# Patient Record
Sex: Female | Born: 1960 | Hispanic: No | Smoking: Never smoker
Health system: Southern US, Community
[De-identification: ages and names within clinical notes are randomized; demographics above are authoritative.]

## PROBLEM LIST (undated history)

## (undated) DIAGNOSIS — A048 Other specified bacterial intestinal infections: Secondary | ICD-10-CM

## (undated) DIAGNOSIS — E785 Hyperlipidemia, unspecified: Secondary | ICD-10-CM

## (undated) DIAGNOSIS — I10 Essential (primary) hypertension: Secondary | ICD-10-CM

## (undated) HISTORY — DX: Other specified bacterial intestinal infections: A04.8

## (undated) HISTORY — DX: Essential (primary) hypertension: I10

## (undated) HISTORY — PX: NO PAST SURGERIES: SHX2092

---

## 2013-10-31 ENCOUNTER — Ambulatory Visit: Payer: Self-pay

## 2013-11-02 ENCOUNTER — Ambulatory Visit: Payer: Self-pay

## 2014-04-24 ENCOUNTER — Ambulatory Visit: Payer: Self-pay | Attending: Internal Medicine

## 2014-07-10 ENCOUNTER — Ambulatory Visit (INDEPENDENT_AMBULATORY_CARE_PROVIDER_SITE_OTHER): Payer: No Typology Code available for payment source | Admitting: Family Medicine

## 2014-07-10 ENCOUNTER — Telehealth: Payer: Self-pay | Admitting: Family Medicine

## 2014-07-10 VITALS — BP 154/80 | HR 106 | Temp 101.9°F | Resp 17 | Ht 65.5 in | Wt 175.0 lb

## 2014-07-10 DIAGNOSIS — J020 Streptococcal pharyngitis: Secondary | ICD-10-CM

## 2014-07-10 DIAGNOSIS — J029 Acute pharyngitis, unspecified: Secondary | ICD-10-CM

## 2014-07-10 DIAGNOSIS — R300 Dysuria: Secondary | ICD-10-CM

## 2014-07-10 DIAGNOSIS — R110 Nausea: Secondary | ICD-10-CM

## 2014-07-10 DIAGNOSIS — R509 Fever, unspecified: Secondary | ICD-10-CM

## 2014-07-10 DIAGNOSIS — E860 Dehydration: Secondary | ICD-10-CM

## 2014-07-10 DIAGNOSIS — N3000 Acute cystitis without hematuria: Secondary | ICD-10-CM

## 2014-07-10 LAB — POCT URINALYSIS DIPSTICK
Bilirubin, UA: NEGATIVE
Glucose, UA: NEGATIVE
KETONES UA: 15
NITRITE UA: NEGATIVE
PH UA: 6
Protein, UA: 100
SPEC GRAV UA: 1.025
Urobilinogen, UA: 1

## 2014-07-10 LAB — COMPLETE METABOLIC PANEL WITH GFR
ALBUMIN: 3.8 g/dL (ref 3.5–5.2)
ALT: 9 U/L (ref 0–35)
AST: 14 U/L (ref 0–37)
Alkaline Phosphatase: 72 U/L (ref 39–117)
BUN: 11 mg/dL (ref 6–23)
CALCIUM: 9.1 mg/dL (ref 8.4–10.5)
CO2: 23 mEq/L (ref 19–32)
Chloride: 99 mEq/L (ref 96–112)
Creat: 0.81 mg/dL (ref 0.50–1.10)
GFR, EST NON AFRICAN AMERICAN: 83 mL/min
GLUCOSE: 119 mg/dL — AB (ref 70–99)
POTASSIUM: 4.1 meq/L (ref 3.5–5.3)
Sodium: 136 mEq/L (ref 135–145)
Total Bilirubin: 0.5 mg/dL (ref 0.2–1.2)
Total Protein: 7.2 g/dL (ref 6.0–8.3)

## 2014-07-10 LAB — POCT CBC
GRANULOCYTE PERCENT: 86.7 % — AB (ref 37–80)
HEMATOCRIT: 41.1 % (ref 37.7–47.9)
Hemoglobin: 13.5 g/dL (ref 12.2–16.2)
Lymph, poc: 1.2 (ref 0.6–3.4)
MCH, POC: 27.7 pg (ref 27–31.2)
MCHC: 32.8 g/dL (ref 31.8–35.4)
MCV: 84.4 fL (ref 80–97)
MID (cbc): 0.4 (ref 0–0.9)
MPV: 8.2 fL (ref 0–99.8)
POC GRANULOCYTE: 10.5 — AB (ref 2–6.9)
POC LYMPH %: 9.8 % — AB (ref 10–50)
POC MID %: 3.5 %M (ref 0–12)
Platelet Count, POC: 190 10*3/uL (ref 142–424)
RBC: 4.88 M/uL (ref 4.04–5.48)
RDW, POC: 13.9 %
WBC: 12.1 10*3/uL — AB (ref 4.6–10.2)

## 2014-07-10 LAB — POCT UA - MICROSCOPIC ONLY
Casts, Ur, LPF, POC: NEGATIVE
Crystals, Ur, HPF, POC: NEGATIVE
MUCUS UA: NEGATIVE
Yeast, UA: NEGATIVE

## 2014-07-10 LAB — POCT RAPID STREP A (OFFICE): Rapid Strep A Screen: POSITIVE — AB

## 2014-07-10 MED ORDER — ACETAMINOPHEN 325 MG PO TABS
1000.0000 mg | ORAL_TABLET | Freq: Once | ORAL | Status: AC
  Administered 2014-07-10: 975 mg via ORAL

## 2014-07-10 MED ORDER — CEFTRIAXONE SODIUM 1 G IJ SOLR
1.0000 g | Freq: Once | INTRAMUSCULAR | Status: AC
  Administered 2014-07-10: 1 g via INTRAMUSCULAR

## 2014-07-10 MED ORDER — CEPHALEXIN 500 MG PO CAPS: 500.0000 mg | ORAL_CAPSULE | Freq: Three times a day (TID) | ORAL | Status: DC

## 2014-07-10 MED ORDER — ONDANSETRON 4 MG PO TBDP: 4.0000 mg | ORAL_TABLET | Freq: Three times a day (TID) | ORAL | Status: DC | PRN

## 2014-07-10 NOTE — Progress Notes (Addendum)
Subjective:  Patient ID: April Bell, female    DOB: 1960-07-14  Age: 54 y.o. MRN: [id number]  54 year old [place] lady who has been in the [place] since this spring. She does not speak [place]. She is accompanied by her relatives who do speak [place]. Her cousin who is with her is a nephrologist, currently moving to [facility], who is able to communicate well and the other niece is a Physiological scientist so he is capable of communicating well also. The patient had some dysuria last week. It was during the Ramadan fast so she was not getting much to drink. Yesterday she got worse. She had a sore throat. She has felt nauseated but not vomited. She developed a high fever. She still had some more dysuria. She is not on any regular medications. She took some Tylenol, none today. She is getting very weak feeling and weak enough that it is hard to walk.  No major past medical history. Lives with her family. Does not work. No major familial diseases.  Review of systems: HEENT sore throat Respiratory: Unremarkable Cardiovascular: Unremarkable GI: Not much appetite GU: Dysuria Constitutional: Weak Endocrine: Unremarkable Dermatologic: Unremarkable   She is not on any chronic medications and does not have any chronic illnesses. She is usually healthy.   Objective:   Ill-appearing lady, bundled up in her Islamic garb. Throat is erythematous without exudate. Right tonsil is swollen more than the left. Neck supple without significant nodes. Chest is clear to auscultation. Heart regular without murmurs. Tachycardic. Abdomen soft without mass or tenderness. Her skin is very warm to touch. Extremities without edema. Deviated tenderness.  Results for orders placed or performed in visit on 07/10/14  POCT CBC  Result Value Ref Range   WBC 12.1 (A) 4.6 - 10.2 K/uL   Lymph, poc 1.2 0.6 - 3.4   POC LYMPH PERCENT 9.8 (A) 10 - 50 %L   MID (cbc) 0.4 0 - 0.9   POC MID % 3.5 0 - 12 %M   POC Granulocyte  10.5 (A) 2 - 6.9   Granulocyte percent 86.7 (A) 37 - 80 %G   RBC 4.88 4.04 - 5.48 M/uL   Hemoglobin 13.5 12.2 - 16.2 g/dL   HCT, POC 03.4 74.2 - 47.9 %   MCV 84.4 80 - 97 fL   MCH, POC 27.7 27 - 31.2 pg   MCHC 32.8 31.8 - 35.4 g/dL   RDW, POC 59.5 %   Platelet Count, POC 190 142 - 424 K/uL   MPV 8.2 0 - 99.8 fL  POCT urinalysis dipstick  Result Value Ref Range   Color, UA yellow    Clarity, UA cloudy    Glucose, UA neg    Bilirubin, UA neg    Ketones, UA 15    Spec Grav, UA 1.025    Blood, UA mod    pH, UA 6.0    Protein, UA 100    Urobilinogen, UA 1.0    Nitrite, UA neg    Leukocytes, UA large (3+) (A) Negative  POCT UA - Microscopic Only  Result Value Ref Range   WBC, Ur, HPF, POC tntc    RBC, urine, microscopic 0-2    Bacteria, U Microscopic many    Mucus, UA neg    Epithelial cells, urine per micros 10-15    Crystals, Ur, HPF, POC neg    Casts, Ur, LPF, POC neg    Yeast, UA neg   POCT rapid strep A  Result Value Ref Range  Rapid Strep A Screen Positive (A) Negative   Repeat exam reveals a temperature of 102.3 at 1:05 PM. Still ill-appearing.  Assessment & Plan:   Assessment: Streptococcal pharyngitis Urinary tract infection Fever Generalized malaise Dehydration  Patient is quite ill and this was a fairly high acuity visit. Plan: Patient Instructions  Push fluids (nondairy, nonalcoholic)  Take ondansetron if needed for nausea  Take cephalexin 500 mg 3 times daily for infection  Tylenol 1000 mg 3 times daily or ibuprofen 600 mg 3 times daily if needed for fever  If worse return or go to the emergency room     Nagee Goates, MD 07/10/2014

## 2014-07-10 NOTE — Telephone Encounter (Signed)
Patient was seen today and had two prescriptions sent in. Patient is on [facility] financial assistance program and [facility]   [phone]

## 2014-07-10 NOTE — Patient Instructions (Addendum)
Push fluids (nondairy, nonalcoholic)  Take ondansetron if needed for nausea  Take cephalexin 500 mg 3 times daily for infection  Tylenol 1000 mg 3 times daily or ibuprofen 600 mg 3 times daily if needed for fever  If worse return or go to the emergency room  Urinary Tract Infection Urinary tract infections (UTIs) can develop anywhere along your urinary tract. Your urinary tract is your body's drainage system for removing wastes and extra water. Your urinary tract includes two kidneys, two ureters, a bladder, and a urethra. Your kidneys are a pair of bean-shaped organs. Each kidney is about the size of your fist. They are located below your ribs, one on each side of your spine. CAUSES Infections are caused by microbes, which are microscopic organisms, including fungi, viruses, and bacteria. These organisms are so small that they can only be seen through a microscope. Bacteria are the microbes that most commonly cause UTIs. SYMPTOMS  Symptoms of UTIs may vary by age and gender of the patient and by the location of the infection. Symptoms in young women typically include a frequent and intense urge to urinate and a painful, burning feeling in the bladder or urethra during urination. Older women and men are more likely to be tired, shaky, and weak and have muscle aches and abdominal pain. A fever may mean the infection is in your kidneys. Other symptoms of a kidney infection include pain in your back or sides below the ribs, nausea, and vomiting. DIAGNOSIS To diagnose a UTI, your caregiver will ask you about your symptoms. Your caregiver also will ask to provide a urine sample. The urine sample will be tested for bacteria and white blood cells. White blood cells are made by your body to help fight infection. TREATMENT  Typically, UTIs can be treated with medication. Because most UTIs are caused by a bacterial infection, they usually can be treated with the use of antibiotics. The choice of antibiotic  and length of treatment depend on your symptoms and the type of bacteria causing your infection. HOME CARE INSTRUCTIONS  If you were prescribed antibiotics, take them exactly as your caregiver instructs you. Finish the medication even if you feel better after you have only taken some of the medication.  Drink enough water and fluids to keep your urine clear or pale yellow.  Avoid caffeine, tea, and carbonated beverages. They tend to irritate your bladder.  Empty your bladder often. Avoid holding urine for long periods of time.  Empty your bladder before and after sexual intercourse.  After a bowel movement, women should cleanse from front to back. Use each tissue only once. SEEK MEDICAL CARE IF:   You have back pain.  You develop a fever.  Your symptoms do not begin to resolve within 3 days. SEEK IMMEDIATE MEDICAL CARE IF:   You have severe back pain or lower abdominal pain.  You develop chills.  You have nausea or vomiting.  You have continued burning or discomfort with urination. MAKE SURE YOU:   Understand these instructions.  Will watch your condition.  Will get help right away if you are not doing well or get worse. Document Released: 09/30/2004 Document Revised: 06/22/2011 Document Reviewed: 01/29/2011 [facility] Patient Information 2015 [place], [place]. This information is not intended to replace advice given to you by your health care provider. Make sure you discuss any questions you have with your health care provider. Strep Throat Strep throat is an infection of the throat caused by a bacteria named Streptococcus pyogenes.  Your health care provider may call the infection streptococcal "tonsillitis" or "pharyngitis" depending on whether there are signs of inflammation in the tonsils or back of the throat. Strep throat is most common in children aged 5-15 years during the cold months of the year, but it can occur in people of any age during any season. This infection is  spread from person to person (contagious) through coughing, sneezing, or other close contact. SIGNS AND SYMPTOMS   Fever or chills.  Painful, swollen, red tonsils or throat.  Pain or difficulty when swallowing.  White or yellow spots on the tonsils or throat.  Swollen, tender lymph nodes or "glands" of the neck or under the jaw.  Red rash all over the body (rare). DIAGNOSIS  Many different infections can cause the same symptoms. A test must be done to confirm the diagnosis so the right treatment can be given. A "rapid strep test" can help your health care provider make the diagnosis in a few minutes. If this test is not available, a light swab of the infected area can be used for a throat culture test. If a throat culture test is done, results are usually available in a day or two. TREATMENT  Strep throat is treated with antibiotic medicine. HOME CARE INSTRUCTIONS   Gargle with 1 tsp of salt in 1 cup of warm water, 3-4 times per day or as needed for comfort.  Family members who also have a sore throat or fever should be tested for strep throat and treated with antibiotics if they have the strep infection.  Make sure everyone in your household washes their hands well.  Do not share food, drinking cups, or personal items that could cause the infection to spread to others.  You may need to eat a soft food diet until your sore throat gets better.  Drink enough water and fluids to keep your urine clear or pale yellow. This will help prevent dehydration.  Get plenty of rest.  Stay home from school, day care, or work until you have been on antibiotics for 24 hours.  Take medicines only as directed by your health care provider.  Take your antibiotic medicine as directed by your health care provider. Finish it even if you start to feel better. SEEK MEDICAL CARE IF:   The glands in your neck continue to enlarge.  You develop a rash, cough, or earache.  You cough up green,  yellow-brown, or bloody sputum.  You have pain or discomfort not controlled by medicines.  Your problems seem to be getting worse rather than better.  You have a fever. SEEK IMMEDIATE MEDICAL CARE IF:   You develop any new symptoms such as vomiting, severe headache, stiff or painful neck, chest pain, shortness of breath, or trouble swallowing.  You develop severe throat pain, drooling, or changes in your voice.  You develop swelling of the neck, or the skin on the neck becomes red and tender.  You develop signs of dehydration, such as fatigue, dry mouth, and decreased urination.  You become increasingly sleepy, or you cannot wake up completely. MAKE SURE YOU:  Understand these instructions.  Will watch your condition.  Will get help right away if you are not doing well or get worse. Document Released: 12/19/1999 Document Revised: 05/07/2013 Document Reviewed: 02/19/2010 [facility] Patient Information 2015 [place], [place]. This information is not intended to replace advice given to you by your health care provider. Make sure you discuss any questions you have with your health care  provider.  

## 2014-07-10 NOTE — Addendum Note (Signed)
Addended by: Deagan Sevin H on: 07/10/2014 02:04 PM   Modules accepted: Level of Service

## 2014-07-12 LAB — URINE CULTURE

## 2014-09-17 ENCOUNTER — Encounter: Payer: Self-pay | Admitting: Family Medicine

## 2014-09-17 ENCOUNTER — Ambulatory Visit (INDEPENDENT_AMBULATORY_CARE_PROVIDER_SITE_OTHER): Payer: No Typology Code available for payment source | Admitting: Family Medicine

## 2014-09-17 VITALS — BP 132/70 | HR 68 | Temp 98.2°F | Resp 16 | Ht 65.0 in | Wt 180.0 lb

## 2014-09-17 DIAGNOSIS — Z23 Encounter for immunization: Secondary | ICD-10-CM

## 2014-09-17 DIAGNOSIS — K219 Gastro-esophageal reflux disease without esophagitis: Secondary | ICD-10-CM

## 2014-09-17 DIAGNOSIS — R82998 Other abnormal findings in urine: Secondary | ICD-10-CM

## 2014-09-17 DIAGNOSIS — N390 Urinary tract infection, site not specified: Secondary | ICD-10-CM

## 2014-09-17 DIAGNOSIS — R1084 Generalized abdominal pain: Secondary | ICD-10-CM

## 2014-09-17 LAB — POCT URINALYSIS DIP (DEVICE)
Bilirubin Urine: NEGATIVE
Glucose, UA: NEGATIVE mg/dL
Ketones, ur: NEGATIVE mg/dL
NITRITE: NEGATIVE
PH: 7 (ref 5.0–8.0)
PROTEIN: NEGATIVE mg/dL
Specific Gravity, Urine: 1.02 (ref 1.005–1.030)
UROBILINOGEN UA: 0.2 mg/dL (ref 0.0–1.0)

## 2014-09-17 LAB — COMPLETE METABOLIC PANEL WITH GFR
ALT: 11 U/L (ref 6–29)
AST: 17 U/L (ref 10–35)
Albumin: 4 g/dL (ref 3.6–5.1)
Alkaline Phosphatase: 68 U/L (ref 33–130)
BILIRUBIN TOTAL: 0.3 mg/dL (ref 0.2–1.2)
BUN: 14 mg/dL (ref 7–25)
CALCIUM: 9.1 mg/dL (ref 8.6–10.4)
CHLORIDE: 104 mmol/L (ref 98–110)
CO2: 21 mmol/L (ref 20–31)
CREATININE: 0.6 mg/dL (ref 0.50–1.05)
GFR, Est African American: >89 mL/min (ref >=60)
GFR, Est Non African American: >89 mL/min (ref >=60)
Glucose, Bld: 106 mg/dL — ABNORMAL HIGH (ref 65–99)
Potassium: 4.1 mmol/L (ref 3.5–5.3)
Sodium: 139 mmol/L (ref 135–146)
TOTAL PROTEIN: 7.1 g/dL (ref 6.1–8.1)

## 2014-09-17 MED ORDER — OMEPRAZOLE 20 MG PO CPDR: 20.0000 mg | DELAYED_RELEASE_CAPSULE | Freq: Every day | ORAL | Status: DC

## 2014-09-17 NOTE — Progress Notes (Signed)
Subjective:    Patient ID: April Bell, female    DOB: [birth year], 54 y.o.   MRN: [id number]  HPI Ms. April Bell, a 54 year old female accompanied by daughter to establish care. Ms. April Bell primarily speaks [place], but understands some english. Patient is complaining of heartburn for the past several months. She states that she has been receiving primary care from [facility]. Paitent complains of heartburn. This has been associated with belching, dysphagia, heartburn, midespigastric pain and nausea.  She denies abdominal bloating, early satiety, fullness after meals, shortness of breath, upper abdominal discomfort and wheezing. Symptoms have been present for several weeks. She denies dysphagia.  She has not lost weight. She denies melena, hematochezia, hematemesis, and coffee ground emesis. She reports that she has taken OTC Ibuprofen without relief.   History reviewed. No pertinent past medical history. Social History   Social History  . Marital Status: Married    Spouse Name: N/A  . Number of Children: N/A  . Years of Education: N/A   Occupational History  . Not on file.   Social History Main Topics  . Smoking status: Former Games developer  . Smokeless tobacco: Not on file  . Alcohol Use: No  . Drug Use: No  . Sexual Activity: Not on file   Other Topics Concern  . Not on file   Social History Narrative   Review of Systems  Constitutional: Negative.   HENT: Negative.   Eyes: Negative.   Respiratory: Negative.   Cardiovascular: Negative.   Gastrointestinal: Negative.        Heartburn  Endocrine: Negative for polydipsia, polyphagia and polyuria.  Genitourinary: Negative.   Musculoskeletal: Negative.   Skin: Negative.   Neurological: Negative.   Hematological: Negative.   Psychiatric/Behavioral: Negative.        Objective:   Physical Exam  Constitutional: She is oriented to person, place, and time. She appears well-developed and well-nourished.  HENT:   Head: Normocephalic and atraumatic.  Right Ear: External ear normal.  Left Ear: External ear normal.  Mouth/Throat: Oropharynx is clear and moist.  Eyes: Conjunctivae and EOM are normal. Pupils are equal, round, and reactive to light.  Neck: Normal range of motion. Neck supple.  Cardiovascular: Normal rate, regular rhythm, normal heart sounds and intact distal pulses.   Pulmonary/Chest: Effort normal and breath sounds normal.  Abdominal: Soft. Bowel sounds are normal. There is tenderness in the epigastric area.  Musculoskeletal: Normal range of motion.  Neurological: She is alert and oriented to person, place, and time. She has normal reflexes.  Skin: Skin is warm and dry.  Psychiatric: She has a normal mood and affect. Her behavior is normal. Judgment and thought content normal.      BP 132/70 mmHg  Pulse 68  Temp(Src) 98.2 F (36.8 C) (Oral)  Resp 16  Ht  (1.651 m)  Wt 180 lb (81.647 kg)  BMI 29.95 kg/m2 Assessment & Plan:  1. Gastroesophageal reflux disease without esophagitis Will start a 6 week trial of omeprazole Avoid lying down until 2 hours after meals Elevate the head of bed including the chest Patient advised to avoid fatty foods, chocolate, peppermint, sauces, and citrus fruits - omeprazole (PRILOSEC) 20 MG capsule; Take 1 capsule (20 mg total) by mouth daily.  Dispense: 42 capsule; Refill: 3  2.  Generalized abdominal pain Patient advised to start a lowfat, low carbohydrate diet divided over 5-6 small meals and increase water intake.  - POCT urinalysis dipstick - Hemoglobin A1c -  COMPLETE METABOLIC PANEL WITH GFR - CBC with Differential  3. Urine leukocytes -Send urine for culture  4. Need for immunization against influenza  - Flu Vaccine QUAD 36+ mos IM (Fluarix)   Preventative care: Recommend a screening mammogram and pap smear Patient to follow up for a complete physical examination in 1 month  Shannyn Jankowiak M, FNP

## 2014-09-18 LAB — CBC WITH DIFFERENTIAL/PLATELET
Basophils Absolute: 0.1 10*3/uL (ref 0.0–0.1)
Basophils Relative: 1 % (ref 0–1)
EOS ABS: 0.2 10*3/uL (ref 0.0–0.7)
EOS PCT: 4 % (ref 0–5)
HEMATOCRIT: 39 % (ref 36.0–46.0)
Hemoglobin: 12.9 g/dL (ref 12.0–15.0)
LYMPHS ABS: 3.4 10*3/uL (ref 0.7–4.0)
LYMPHS PCT: 59 % — AB (ref 12–46)
MCH: 28.4 pg (ref 26.0–34.0)
MCHC: 33.1 g/dL (ref 30.0–36.0)
MCV: 85.7 fL (ref 78.0–100.0)
MONO ABS: 0.4 10*3/uL (ref 0.1–1.0)
MPV: 10.1 fL (ref 8.6–12.4)
Monocytes Relative: 7 % (ref 3–12)
Neutro Abs: 1.7 10*3/uL (ref 1.7–7.7)
Neutrophils Relative %: 29 % — ABNORMAL LOW (ref 43–77)
PLATELETS: 273 10*3/uL (ref 150–400)
RBC: 4.55 MIL/uL (ref 3.87–5.11)
RDW: 14.2 % (ref 11.5–15.5)
WBC: 5.8 10*3/uL (ref 4.0–10.5)

## 2014-09-18 LAB — HEMOGLOBIN A1C
HEMOGLOBIN A1C: 5.9 % — AB (ref <5.7)
Mean Plasma Glucose: 123 mg/dL — ABNORMAL HIGH (ref <117)

## 2014-09-19 ENCOUNTER — Telehealth: Payer: Self-pay | Admitting: Family Medicine

## 2014-09-19 ENCOUNTER — Other Ambulatory Visit: Payer: Self-pay | Admitting: Family Medicine

## 2014-09-19 DIAGNOSIS — R82998 Other abnormal findings in urine: Secondary | ICD-10-CM | POA: Insufficient documentation

## 2014-09-19 DIAGNOSIS — R7303 Prediabetes: Secondary | ICD-10-CM

## 2014-09-19 DIAGNOSIS — K219 Gastro-esophageal reflux disease without esophagitis: Secondary | ICD-10-CM | POA: Insufficient documentation

## 2014-09-19 LAB — URINE CULTURE

## 2014-09-19 NOTE — Telephone Encounter (Signed)
Spoke with patient's daughter (caregiver) and advised of elevated hgba1c and to have patient do a low fat/carb diet/ to increase water intake to 6 to 8 glasses daily, and to get approximently 150 minutes of cardiovascular exercise  weekly as tolerated. Patient's daughter stated understanding and will have patient follow directions. Thanks!

## 2014-09-19 NOTE — Telephone Encounter (Signed)
Called left message for patient to return call regarding labs.

## 2014-09-19 NOTE — Telephone Encounter (Signed)
Reviewed labs. Hemoglobin a1C is elevated at 5.9, which is consistent with pre-diabetes, goal is <5.7%. Recommend a lowfat, low carbohydrate diet divided over 5-6 small meals, increase water intake to 6-8 glasses, and 150 minutes per week of cardiovascular exercise.   Will re-evaluated hemoglobin a1C in 6 months.    Massie Maroon, FNP

## 2014-10-28 ENCOUNTER — Ambulatory Visit: Payer: No Typology Code available for payment source | Attending: Internal Medicine

## 2014-10-29 ENCOUNTER — Ambulatory Visit (INDEPENDENT_AMBULATORY_CARE_PROVIDER_SITE_OTHER): Payer: No Typology Code available for payment source | Admitting: Family Medicine

## 2014-10-29 ENCOUNTER — Encounter: Payer: Self-pay | Admitting: Family Medicine

## 2014-10-29 VITALS — BP 114/68 | HR 58 | Temp 98.3°F | Resp 16 | Ht 65.0 in | Wt 177.0 lb

## 2014-10-29 DIAGNOSIS — Z1239 Encounter for other screening for malignant neoplasm of breast: Secondary | ICD-10-CM

## 2014-10-29 DIAGNOSIS — K219 Gastro-esophageal reflux disease without esophagitis: Secondary | ICD-10-CM

## 2014-10-29 DIAGNOSIS — R82998 Other abnormal findings in urine: Secondary | ICD-10-CM

## 2014-10-29 DIAGNOSIS — N390 Urinary tract infection, site not specified: Secondary | ICD-10-CM

## 2014-10-29 DIAGNOSIS — R7303 Prediabetes: Secondary | ICD-10-CM

## 2014-10-29 LAB — POCT URINALYSIS DIP (DEVICE)
BILIRUBIN URINE: NEGATIVE
Glucose, UA: NEGATIVE mg/dL
KETONES UR: NEGATIVE mg/dL
NITRITE: NEGATIVE
PH: 7.5 (ref 5.0–8.0)
Protein, ur: NEGATIVE mg/dL
SPECIFIC GRAVITY, URINE: 1.015 (ref 1.005–1.030)
Urobilinogen, UA: 0.2 mg/dL (ref 0.0–1.0)

## 2014-10-29 MED ORDER — OMEPRAZOLE 20 MG PO CPDR: 20.0000 mg | DELAYED_RELEASE_CAPSULE | Freq: Every day | ORAL | Status: DC

## 2014-10-29 NOTE — Patient Instructions (Signed)
Food Choices for Gastroesophageal Reflux Disease, Adult °When you have gastroesophageal reflux disease (GERD), the foods you eat and your eating habits are very important. Choosing the right foods can help ease the discomfort of GERD. °WHAT GENERAL GUIDELINES DO I NEED TO FOLLOW? °· Choose fruits, vegetables, whole grains, low-fat dairy products, and low-fat meat, fish, and poultry. °· Limit fats such as oils, salad dressings, butter, nuts, and avocado. °· Keep a food diary to identify foods that cause symptoms. °· Avoid foods that cause reflux. These may be different for different people. °· Eat frequent small meals instead of three large meals each day. °· Eat your meals slowly, in a relaxed setting. °· Limit fried foods. °· Cook foods using methods other than frying. °· Avoid drinking alcohol. °· Avoid drinking large amounts of liquids with your meals. °· Avoid bending over or lying down until 2-3 hours after eating. °WHAT FOODS ARE NOT RECOMMENDED? °The following are some foods and drinks that may worsen your symptoms: °Vegetables °Tomatoes. Tomato juice. Tomato and spaghetti sauce. Chili peppers. Onion and garlic. Horseradish. °Fruits °Oranges, grapefruit, and lemon (fruit and juice). °Meats °High-fat meats, fish, and poultry. This includes hot dogs, ribs, ham, sausage, salami, and bacon. °Dairy °Whole milk and chocolate milk. Sour cream. Cream. Butter. Ice cream. Cream cheese.  °Beverages °Coffee and tea, with or without caffeine. Carbonated beverages or energy drinks. °Condiments °Hot sauce. Barbecue sauce.  °Sweets/Desserts °Chocolate and cocoa. Donuts. Peppermint and spearmint. °Fats and Oils °High-fat foods, including French fries and potato chips. °Other °Vinegar. Strong spices, such as black pepper, white pepper, red pepper, cayenne, curry powder, cloves, ginger, and chili powder. °The items listed above may not be a complete list of foods and beverages to avoid. Contact your dietitian for more  information. °  °This information is not intended to replace advice given to you by your health care provider. Make sure you discuss any questions you have with your health care provider. °  °Document Released: 12/21/2004 Document Revised: 01/11/2014 Document Reviewed: 10/25/2012 °Elsevier Interactive Patient Education ©2016 [employer] Inc[employer] ° °Gastroesophageal Reflux Disease, Adult °Normally, food travels down the esophagus and stays in the stomach to be digested. However, when a person has gastroesophageal reflux disease (GERD), food and stomach acid move back up into the esophagus. When this happens, the esophagus becomes sore and inflamed. Over time, GERD can create small holes (ulcers) in the lining of the esophagus.  °CAUSES °This condition is caused by a problem with the muscle between the esophagus and the stomach (lower esophageal sphincter, or LES). Normally, the LES muscle closes after food passes through the esophagus to the stomach. When the LES is weakened or abnormal, it does not close properly, and that allows food and stomach acid to go back up into the esophagus. The LES can be weakened by certain dietary substances, medicines, and medical conditions, including: °· Tobacco use. °· Pregnancy. °· Having a hiatal hernia. °· Heavy alcohol use. °· Certain foods and beverages, such as coffee, chocolate, onions, and peppermint. °RISK FACTORS °This condition is more likely to develop in: °· People who have an increased body weight. °· People who have connective tissue disorders. °· People who use NSAID medicines. °SYMPTOMS °Symptoms of this condition include: °· Heartburn. °· Difficult or painful swallowing. °· The feeling of having a lump in the throat. °· A bitter taste in the mouth. °· Bad breath. °· Having a large amount of saliva. °· Having an upset or bloated stomach. °· Belching. °· Chest pain. °·   Shortness of breath or wheezing. °· Ongoing (chronic) cough or a night-time cough. °· Wearing away of  tooth enamel. °· Weight loss. °Different conditions can cause chest pain. Make sure to see your health care provider if you experience chest pain. °DIAGNOSIS °Your health care provider will take a medical history and perform a physical exam. To determine if you have mild or severe GERD, your health care provider may also monitor how you respond to treatment. You may also have other tests, including: °· An endoscopy to examine your stomach and esophagus with a small camera. °· A test that measures the acidity level in your esophagus. °· A test that measures how much pressure is on your esophagus. °· A barium swallow or modified barium swallow to show the shape, size, and functioning of your esophagus. °TREATMENT °The goal of treatment is to help relieve your symptoms and to prevent complications. Treatment for this condition may vary depending on how severe your symptoms are. Your health care provider may recommend: °· Changes to your diet. °· Medicine. °· Surgery. °HOME CARE INSTRUCTIONS °Diet °· Follow a diet as recommended by your health care provider. This may involve avoiding foods and drinks such as: °¨ Coffee and tea (with or without caffeine). °¨ Drinks that contain alcohol. °¨ Energy drinks and sports drinks. °¨ Carbonated drinks or sodas. °¨ Chocolate and cocoa. °¨ Peppermint and mint flavorings. °¨ Garlic and onions. °¨ Horseradish. °¨ Spicy and acidic foods, including peppers, chili powder, curry powder, vinegar, hot sauces, and barbecue sauce. °¨ Citrus fruit juices and citrus fruits, such as oranges, lemons, and limes. °¨ Tomato-based foods, such as red sauce, chili, salsa, and pizza with red sauce. °¨ Fried and fatty foods, such as donuts, french fries, potato chips, and high-fat dressings. °¨ High-fat meats, such as hot dogs and fatty cuts of red and white meats, such as rib eye steak, sausage, ham, and bacon. °¨ High-fat dairy items, such as whole milk, butter, and cream cheese. °· Eat small,  frequent meals instead of large meals. °· Avoid drinking large amounts of liquid with your meals. °· Avoid eating meals during the 2-3 hours before bedtime. °· Avoid lying down right after you eat. °· Do not exercise right after you eat. ° General Instructions  °· Pay attention to any changes in your symptoms. °· Take over-the-counter and prescription medicines only as told by your health care provider. Do not take aspirin, ibuprofen, or other NSAIDs unless your health care provider told you to do so. °· Do not use any tobacco products, including cigarettes, chewing tobacco, and e-cigarettes. If you need help quitting, ask your health care provider. °· Wear loose-fitting clothing. Do not wear anything tight around your waist that causes pressure on your abdomen. °· Raise (elevate) the head of your bed 6 inches (15cm). °· Try to reduce your stress, such as with yoga or meditation. If you need help reducing stress, ask your health care provider. °· If you are overweight, reduce your weight to an amount that is healthy for you. Ask your health care provider for guidance about a safe weight loss goal. °· Keep all follow-up visits as told by your health care provider. This is important. °SEEK MEDICAL CARE IF: °· You have new symptoms. °· You have unexplained weight loss. °· You have difficulty swallowing, or it hurts to swallow. °· You have wheezing or a persistent cough. °· Your symptoms do not improve with treatment. °· You have a hoarse voice. °SEEK IMMEDIATE MEDICAL CARE IF: °· You have pain   in your arms, neck, jaw, teeth, or back. °· You feel sweaty, dizzy, or light-headed. °· You have chest pain or shortness of breath. °· You vomit and your vomit looks like blood or coffee grounds. °· You faint. °· Your stool is bloody or black. °· You cannot swallow, drink, or eat. °  °This information is not intended to replace advice given to you by your health care provider. Make sure you discuss any questions you have with  your health care provider. °  °Document Released: 09/30/2004 Document Revised: 09/11/2014 Document Reviewed: 04/17/2014 °Elsevier Interactive Patient Education ©2016 Elsevier Inc. ° °

## 2014-10-29 NOTE — Progress Notes (Signed)
Subjective:    Patient ID: April Bell, female    DOB: 11/13/1960, 54 y.o.   MRN: [id number]  Gastroesophageal Reflux She reports no abdominal pain, no belching, no chest pain, no choking, no coughing, no dysphagia, no early satiety, no globus sensation, no heartburn, no hoarse voice, no nausea, no sore throat, no stridor, no tooth decay, no water brash or no wheezing. This is a recurrent problem. The current episode started more than 1 month ago. The problem occurs rarely. The problem has been gradually improving. Nothing aggravates the symptoms. Pertinent negatives include no fatigue. Risk factors include lack of exercise and caffeine use. She has tried a histamine-2 antagonist for the symptoms. The treatment provided significant relief.   April Bell, a 54 year old female accompanied by daughter for a 1 month follow up of GERD. April Bell primarily speaks Arabic, but understands some english. Her daughter is assisting with interpreting.  Patient states that heartburn has improved on trial of Omeprazole.   She denies abdominal bloating, early satiety, fullness after meals, shortness of breath, upper abdominal discomfort and wheezing.  She has not lost weight. She denies melena, hematochezia, hematemesis, and coffee ground emesis.   No past medical history on file. Social History   Social History  . Marital Status: Married    Spouse Name: N/A  . Number of Children: N/A  . Years of Education: N/A   Occupational History  . Not on file.   Social History Main Topics  . Smoking status: Former Games developermoker  . Smokeless tobacco: Not on file  . Alcohol Use: No  . Drug Use: No  . Sexual Activity: Not on file   Other Topics Concern  . Not on file   Social History Narrative   Immunization History  Administered Date(s) Administered  . Influenza,inj,Quad PF,36+ Mos 09/17/2014   Review of Systems  Constitutional: Negative.  Negative for fatigue.  HENT: Negative.  Negative for  hoarse voice and sore throat.   Eyes: Negative.   Respiratory: Negative.  Negative for cough, choking and wheezing.   Cardiovascular: Negative.  Negative for chest pain.  Gastrointestinal: Negative for heartburn, dysphagia, nausea, abdominal pain, blood in stool and abdominal distention.  Endocrine: Negative for polydipsia, polyphagia and polyuria.  Genitourinary: Negative.   Musculoskeletal: Negative.   Skin: Negative.   Allergic/Immunologic: Negative for immunocompromised state.  Neurological: Negative.   Hematological: Negative.   Psychiatric/Behavioral: Negative.        Objective:   Physical Exam  Constitutional: She is oriented to person, place, and time. She appears well-developed and well-nourished.  HENT:  Head: Normocephalic and atraumatic.  Right Ear: External ear normal.  Left Ear: External ear normal.  Mouth/Throat: Oropharynx is clear and moist.  Eyes: Conjunctivae and EOM are normal. Pupils are equal, round, and reactive to light.  Neck: Normal range of motion. Neck supple.  Cardiovascular: Normal rate, regular rhythm, normal heart sounds and intact distal pulses.   Pulmonary/Chest: Effort normal and breath sounds normal.  Abdominal: Soft. Bowel sounds are normal. There is tenderness in the epigastric area.  Musculoskeletal: Normal range of motion.  Neurological: She is alert and oriented to person, place, and time. She has normal reflexes.  Skin: Skin is warm and dry.  Psychiatric: She has a normal mood and affect. Her behavior is normal. Judgment and thought content normal.      BP 114/68 mmHg  Pulse 58  Temp(Src) 98.3 F (36.8 C) (Oral)  Resp 16  Ht 5\' 5"  (1.651  m)  Wt 177 lb (80.287 kg)  BMI 29.45 kg/m2  SpO2 100% Assessment & Plan:   1. Gastroesophageal reflux disease without esophagitis Will continue omeprazole.  Avoid lying down until 2 hours after meals Elevate the head of bed including the chest Patient advised to avoid fatty foods, chocolate,  peppermint, sauces, and citrus fruits - omeprazole (PRILOSEC) 20 MG capsule; Take 1 capsule (20 mg total) by mouth daily.  Dispense: 90 capsule; Refill: 1  2. Prediabetes Recommend a lowfat, low carbohydrate diet divided over 5-6 small meals, increase water intake to 6-8 glasses, and 150 minutes per week of cardiovascular exercise.   3. Screening for breast cancer - MM Digital Screening; Future  4. Urine leukocytes increased Large leukocyte present.  - Urine culture   Minyon Billiter M, FNP  RTC: 6 months for GERD  The patient was given clear instructions to go to ER or return to medical center if symptoms do not improve, worsen or new problems develop. The patient verbalized understanding. Will notify patient with laboratory results.

## 2014-10-30 LAB — URINE CULTURE
COLONY COUNT: NO GROWTH
ORGANISM ID, BACTERIA: NO GROWTH

## 2015-05-30 ENCOUNTER — Ambulatory Visit: Payer: No Typology Code available for payment source | Attending: Family Medicine

## 2015-06-03 ENCOUNTER — Ambulatory Visit: Payer: No Typology Code available for payment source | Admitting: Family Medicine

## 2015-07-14 ENCOUNTER — Ambulatory Visit: Payer: No Typology Code available for payment source | Admitting: Family Medicine

## 2015-08-18 ENCOUNTER — Encounter: Payer: Self-pay | Admitting: Family Medicine

## 2015-08-18 ENCOUNTER — Ambulatory Visit (INDEPENDENT_AMBULATORY_CARE_PROVIDER_SITE_OTHER): Payer: No Typology Code available for payment source | Admitting: Family Medicine

## 2015-08-18 VITALS — BP 127/71 | HR 100 | Temp 98.4°F | Ht 65.0 in | Wt 177.0 lb

## 2015-08-18 DIAGNOSIS — Z0000 Encounter for general adult medical examination without abnormal findings: Secondary | ICD-10-CM

## 2015-08-18 LAB — CBC WITH DIFFERENTIAL/PLATELET
Basophils Absolute: 48 cells/uL (ref 0–200)
Basophils Relative: 1 %
EOS ABS: 192 {cells}/uL (ref 15–500)
Eosinophils Relative: 4 %
HCT: 41 % (ref 35.0–45.0)
Hemoglobin: 13.7 g/dL (ref 11.7–15.5)
Lymphs Abs: 2208 cells/uL (ref 850–3900)
MCH: 28.6 pg (ref 27.0–33.0)
MCHC: 33.4 g/dL (ref 32.0–36.0)
MCV: 85.6 fL (ref 80.0–100.0)
MONO ABS: 384 {cells}/uL (ref 200–950)
MONOS PCT: 8 %
MPV: 9.9 fL (ref 7.5–12.5)
NEUTROS PCT: 41 %
Neutro Abs: 1968 cells/uL (ref 1500–7800)
PLATELETS: 268 10*3/uL (ref 140–400)
RBC: 4.79 MIL/uL (ref 3.80–5.10)
RDW: 13.9 % (ref 11.0–15.0)
WBC: 4.8 10*3/uL (ref 3.8–10.8)

## 2015-08-18 NOTE — Patient Instructions (Signed)
Check drug store for shoe insert for plantar fascitis Do exercised described in handout.

## 2015-08-18 NOTE — Progress Notes (Signed)
April LeepSalwa Bell, is a 55 y.o. female  GUY:[id number]  [id number]:[id number]  MRN:[id number]  DOB - 11/15/1960  CC:  Chief Complaint  Patient presents with  . Follow-up    has a couple of new issues, heel on left foot has been painful for several months, also has possible fungal infection toenails       HPI: April SalvageSalwa Bell is a 55 y.o. female here for follow chronic conditions. She has a history of GERD, which she reports has resolved and a history of prediabetes. Her main complaints today are of left heel pain and toenail fungus. With the help of her daughter, she reports the heel pain on bothers her when she first get out of bed.   Health Maintenance: She is in need of Tdap, Mammogram and PAP. She is in process of arranging mammogram. She also needs, HIV and Hep C screening.   No Known Allergies History reviewed. No pertinent past medical history. Current Outpatient Prescriptions on File Prior to Visit  Medication Sig Dispense Refill  . omeprazole (PRILOSEC) 20 MG capsule Take 1 capsule (20 mg total) by mouth daily. 90 capsule 1   No current facility-administered medications on file prior to visit.    Family History  Problem Relation Age of Onset  . Hypertension Mother    Social History   Social History  . Marital status: Married    Spouse name: N/A  . Number of children: N/A  . Years of education: N/A   Occupational History  . Not on file.   Social History Main Topics  . Smoking status: Former Games developermoker  . Smokeless tobacco: Never Used  . Alcohol use No  . Drug use: No  . Sexual activity: Not on file   Other Topics Concern  . Not on file   Social History Narrative  . No narrative on file    Review of Systems: Constitutional: Negative for fever, chills, appetite change, weight loss,  Fatigue. Skin: Negative for rashes or lesions of concern. HENT: Negative for ear pain, ear discharge.nose bleeds Eyes: Negative for pain, discharge, redness, itching and visual disturbance. Neck:  Negative for pain, stiffness Respiratory: Negative for cough, shortness of breath,   Cardiovascular: Negative for chest pain, palpitations and leg swelling. Gastrointestinal: Negative for abdominal pain, nausea, vomiting, diarrhea, constipations Genitourinary: Negative for dysuria, urgency, frequency, hematuria,  Musculoskeletal: Negative for back pain, joint pain, joint  swelling, and gait problem.Negative for weakness.Positive for heel pain Neurological: Negative for dizziness, tremors, seizures, syncope,   light-headedness, numbness and headaches.  Hematological: Negative for easy bruising or bleeding Psychiatric/Behavioral: Negative for depression, anxiety, decreased concentration, confusion   Objective:   Vitals:   08/18/15 0947  BP: 127/71  Pulse: 100  Temp: 98.4 F (36.9 C)    Physical Exam: Constitutional: Patient appears well-developed and well-nourished. No distress. HENT: Normocephalic, atraumatic, External right and left ear normal. Oropharynx is clear and moist.  Eyes: Conjunctivae and EOM are normal. PERRLA, no scleral icterus. Neck: Normal ROM. Neck supple. No lymphadenopathy, No thyromegaly. CVS: RRR, S1/S2 +, no murmurs, no gallops, no rubs Pulmonary: Effort and breath sounds normal, no stridor, rhonchi, wheezes, rales.  Abdominal: Soft. Normoactive BS,, no distension, tenderness, rebound or guarding.  Musculoskeletal: Normal range of motion. No edema and no tenderness. Tenderness of inferior left heel Neuro: Alert.Normal muscle tone coordination. Non-focal Skin: Skin is warm and dry. No rash noted. Not diaphoretic. No erythema. No pallor. Psychiatric: Normal mood and affect. Behavior, judgment, thought content normal.  Lab Results  Component  Value Date   WBC 5.8 09/17/2014   HGB 12.9 09/17/2014   HCT 39.0 09/17/2014   MCV 85.7 09/17/2014   PLT 273 09/17/2014   Lab Results  Component Value Date   CREATININE 0.60 09/17/2014   BUN 14 09/17/2014   NA 139  09/17/2014   K 4.1 09/17/2014   CL 104 09/17/2014   CO2 21 09/17/2014    Lab Results  Component Value Date   HGBA1C 5.9 (H) 09/17/2014   Lipid Panel  No results found for: CHOL, TRIG, HDL, CHOLHDL, VLDL, LDLCALC     Assessment and plan:   1. Healthcare maintenance  - COMPLETE METABOLIC PANEL WITH GFR - CBC w/Diff - Lipid Profile - HIV antibody (with reflex) - Hepatitis C Antibody - POC Hemoccult Bld/Stl (3-Cd Home Screen); Future  2. Plantar Fascitis -Have provided exercise sheet and recommended shoe inserts for PF>   Return in about 6 months (around 02/18/2016).  The patient was given clear instructions to go to ER or return to medical center if symptoms don't improve, worsen or new problems develop. The patient verbalized understanding.    Henrietta HooverLinda C Libertie Hausler FNP  08/18/2015, 11:48 AM

## 2015-08-19 ENCOUNTER — Telehealth: Payer: Self-pay

## 2015-08-19 LAB — COMPLETE METABOLIC PANEL WITH GFR
ALBUMIN: 3.8 g/dL (ref 3.6–5.1)
ALT: 9 U/L (ref 6–29)
AST: 14 U/L (ref 10–35)
Alkaline Phosphatase: 68 U/L (ref 33–130)
BILIRUBIN TOTAL: 0.3 mg/dL (ref 0.2–1.2)
BUN: 8 mg/dL (ref 7–25)
CO2: 23 mmol/L (ref 20–31)
Calcium: 9.3 mg/dL (ref 8.6–10.4)
Chloride: 106 mmol/L (ref 98–110)
Creat: 0.75 mg/dL (ref 0.50–1.05)
GFR, Est African American: >89 mL/min (ref >=60)
GLUCOSE: 102 mg/dL — AB (ref 65–99)
Potassium: 4.2 mmol/L (ref 3.5–5.3)
SODIUM: 142 mmol/L (ref 135–146)
TOTAL PROTEIN: 6.9 g/dL (ref 6.1–8.1)

## 2015-08-19 LAB — LIPID PANEL
CHOL/HDL RATIO: 2.8 ratio (ref <=5.0)
Cholesterol: 242 mg/dL — ABNORMAL HIGH (ref 125–200)
HDL: 88 mg/dL (ref >=46)
LDL Cholesterol: 138 mg/dL — ABNORMAL HIGH (ref <130)
Triglycerides: 80 mg/dL (ref <150)
VLDL: 16 mg/dL (ref <30)

## 2015-08-19 LAB — HEPATITIS C ANTIBODY: HCV AB: NEGATIVE

## 2015-08-19 LAB — HIV ANTIBODY (ROUTINE TESTING W REFLEX): HIV: NONREACTIVE

## 2015-08-19 NOTE — Telephone Encounter (Signed)
Called, no answer. Message was left for patient to return call. Thanks!

## 2015-08-19 NOTE — Telephone Encounter (Signed)
-----   Message from Henrietta HooverLinda C Bernhardt, NP sent at 08/19/2015  7:47 AM EDT ----- HIV, HCV negative. Cmet, CBC wnl. Cholesterol 242 total, LDL 138. If will need to add medication for cholesterol.

## 2015-08-19 NOTE — Telephone Encounter (Signed)
Patient verified DOB Patient is aware of lab results being mostly normal except for cholesterol level being high. Patient is aware of a cholesterol medication needing to be implemented. PCP [facility]. No further questions at this time.

## 2015-08-26 ENCOUNTER — Other Ambulatory Visit: Payer: Self-pay | Admitting: Family Medicine

## 2015-08-26 MED ORDER — PRAVASTATIN SODIUM 40 MG PO TABS: 40.0000 mg | ORAL_TABLET | Freq: Every day | ORAL | 3 refills | Status: DC

## 2015-08-26 MED FILL — ?PRAVASTATIN NA 40 MG TAB: 40 MG | 30 days supply | Qty: 30 | Fill #0

## 2015-10-20 MED FILL — PRAVASTATIN NA 40 MG TAB: 40 | 30 days supply | Qty: 30 | Fill #1

## 2015-12-10 ENCOUNTER — Other Ambulatory Visit: Payer: Self-pay | Admitting: Family Medicine

## 2015-12-10 DIAGNOSIS — Z1239 Encounter for other screening for malignant neoplasm of breast: Secondary | ICD-10-CM

## 2015-12-15 ENCOUNTER — Ambulatory Visit
Admission: RE | Admit: 2015-12-15 | Discharge: 2015-12-15 | Disposition: A | Discharge status: Home / Self Care | Payer: No Typology Code available for payment source | Source: Ambulatory Visit | Attending: Family Medicine | Admitting: Family Medicine

## 2015-12-15 DIAGNOSIS — Z1239 Encounter for other screening for malignant neoplasm of breast: Secondary | ICD-10-CM

## 2015-12-16 ENCOUNTER — Other Ambulatory Visit: Payer: Self-pay | Admitting: Family Medicine

## 2015-12-16 DIAGNOSIS — R928 Other abnormal and inconclusive findings on diagnostic imaging of breast: Secondary | ICD-10-CM

## 2016-01-09 ENCOUNTER — Ambulatory Visit: Payer: No Typology Code available for payment source | Attending: Internal Medicine

## 2016-01-09 MED FILL — PRAVASTATIN NA 40 MG TAB: 40 | 30 days supply | Qty: 30 | Fill #2

## 2016-01-23 ENCOUNTER — Other Ambulatory Visit (HOSPITAL_COMMUNITY): Payer: Self-pay | Admitting: *Deleted

## 2016-01-23 DIAGNOSIS — R928 Other abnormal and inconclusive findings on diagnostic imaging of breast: Secondary | ICD-10-CM

## 2016-02-12 ENCOUNTER — Ambulatory Visit (HOSPITAL_COMMUNITY)
Admission: RE | Admit: 2016-02-12 | Discharge: 2016-02-12 | Disposition: A | Discharge status: Home / Self Care | Payer: Self-pay | Source: Ambulatory Visit | Attending: Obstetrics and Gynecology | Admitting: Obstetrics and Gynecology

## 2016-02-12 ENCOUNTER — Encounter (HOSPITAL_COMMUNITY): Payer: Self-pay

## 2016-02-12 ENCOUNTER — Ambulatory Visit
Admission: RE | Admit: 2016-02-12 | Discharge: 2016-02-12 | Disposition: A | Discharge status: Home / Self Care | Payer: No Typology Code available for payment source | Source: Ambulatory Visit | Attending: Obstetrics and Gynecology | Admitting: Obstetrics and Gynecology

## 2016-02-12 VITALS — BP 120/80 | Temp 97.7°F | Ht 66.0 in | Wt 152.4 lb

## 2016-02-12 DIAGNOSIS — R928 Other abnormal and inconclusive findings on diagnostic imaging of breast: Secondary | ICD-10-CM

## 2016-02-12 DIAGNOSIS — Z01419 Encounter for gynecological examination (general) (routine) without abnormal findings: Secondary | ICD-10-CM

## 2016-02-12 HISTORY — DX: Hyperlipidemia, unspecified: E78.5

## 2016-02-12 LAB — HM PAP SMEAR

## 2016-02-12 LAB — RESULTS CONSOLE HPV: CHL HPV: NEGATIVE

## 2016-02-12 NOTE — Progress Notes (Signed)
[facility] due to having a screening mammogram completed 12/15/2015 that additional imaging of the right breast is recommended.   Pap Smear: Pap smear completed today. Per patient has never had a Pap smear completed. No Pap smear results are in EPIC.  Physical exam: Breasts Breasts symmetrical. No skin abnormalities bilateral breasts. No nipple retraction bilateral breasts. No nipple discharge bilateral breasts. No lymphadenopathy. No lumps palpated bilateral breasts. No complaints of pain or tenderness on exam. [facility] for a right breast diagnostic mammogram and possible ultrasound per recommendation. Appointment scheduled for Thursday, February 12, 2016 at 1050.  Pelvic/Bimanual   Ext Genitalia No lesions, no swelling and no discharge observed on external genitalia.         Vagina Vagina pink and normal texture. No lesions or discharge observed in vagina.          Cervix Cervix is present. Cervix pink and of normal texture. No discharge observed.     Uterus Uterus is present and palpable. Uterus in normal position and normal size.        Adnexae Bilateral ovaries present and palpable. No tenderness on palpation.          Rectovaginal No rectal exam completed today since patient had no rectal complaints. No skin abnormalities observed on exam.    Smoking History: Patient has never smoked.  Patient Navigation: Patient education provided. Access to services provided for patient through [facility] program.   Colorectal Cancer Screening: Per patient has never had a colonoscopy completed. No complaints today. FIT Test given to patient to complete and return to [facility].  Patient refused Arabic interpreter. Patient wanted her daughter Fonnie Birkenheadomadur Saad to interpret. Patient's daughter signed a release to interpret.

## 2016-02-12 NOTE — Patient Instructions (Signed)
Explained breast self awareness to April Bell. Let patient know [facility] will cover Pap smears and HPV typing every 5 years unless has a history of abnormal Pap smears. [facility] for a right breast diagnostic mammogram and possible ultrasound per recommendation. Appointment scheduled for Thursday, February 12, 2016 at 1050. Let patient know will follow up with her within the next couple weeks with results with results of Pap smear by phone. [name] verbalized understanding.  Luismanuel Corman, Kathaleen Maserhristine Poll, RN 9:50 AM

## 2016-02-16 ENCOUNTER — Encounter (HOSPITAL_COMMUNITY): Payer: Self-pay | Admitting: *Deleted

## 2016-02-17 ENCOUNTER — Ambulatory Visit: Payer: No Typology Code available for payment source | Admitting: Family Medicine

## 2016-02-18 LAB — CYTOLOGY - PAP
DIAGNOSIS: NEGATIVE
HPV (WINDOPATH): NOT DETECTED

## 2016-03-08 ENCOUNTER — Encounter: Payer: Self-pay | Admitting: Family Medicine

## 2016-03-08 ENCOUNTER — Ambulatory Visit (INDEPENDENT_AMBULATORY_CARE_PROVIDER_SITE_OTHER): Payer: Self-pay | Admitting: Family Medicine

## 2016-03-08 VITALS — BP 114/64 | HR 64 | Temp 98.3°F | Resp 14 | Ht 65.0 in | Wt 148.0 lb

## 2016-03-08 DIAGNOSIS — R7303 Prediabetes: Secondary | ICD-10-CM

## 2016-03-08 DIAGNOSIS — Z23 Encounter for immunization: Secondary | ICD-10-CM

## 2016-03-08 DIAGNOSIS — E785 Hyperlipidemia, unspecified: Secondary | ICD-10-CM | POA: Insufficient documentation

## 2016-03-08 LAB — POCT GLYCOSYLATED HEMOGLOBIN (HGB A1C): Hemoglobin A1C: 5.6

## 2016-03-08 LAB — LIPID PANEL
Cholesterol: 224 mg/dL — ABNORMAL HIGH (ref <200)
HDL: 68 mg/dL (ref >50)
LDL CALC: 143 mg/dL — AB (ref <100)
TRIGLYCERIDES: 63 mg/dL (ref <150)
Total CHOL/HDL Ratio: 3.3 Ratio (ref <5.0)
VLDL: 13 mg/dL (ref <30)

## 2016-03-08 MED ORDER — ASPIRIN EC 81 MG PO TBEC: 81.0000 mg | DELAYED_RELEASE_TABLET | Freq: Every day | ORAL | 11 refills | Status: DC

## 2016-03-08 MED ORDER — PRAVASTATIN SODIUM 40 MG PO TABS: 40.0000 mg | ORAL_TABLET | Freq: Every day | ORAL | 3 refills | Status: DC

## 2016-03-08 MED FILL — PRAVASTATIN NA 40 MG TAB: 40 | 30 days supply | Qty: 30 | Fill #3

## 2016-03-08 NOTE — Progress Notes (Signed)
Subjective:    Patient ID: April LeepSalwa Porcher, female    DOB: 01/21/1960, 56 y.o.   MRN: [id number]  Hyperlipidemia  This is a chronic problem. The problem is uncontrolled. She has no history of chronic renal disease, diabetes, hypothyroidism, liver disease, obesity or nephrotic syndrome. Factors aggravating her hyperlipidemia include fatty foods. Pertinent negatives include no chest pain, focal sensory loss, focal weakness, leg pain, myalgias or shortness of breath. Current antihyperlipidemic treatment includes diet change. There are no known risk factors for coronary artery disease.   Past Medical History:  Diagnosis Date  . Hyperlipidemia    Immunization History  Administered Date(s) Administered  . Influenza,inj,Quad PF,36+ Mos 09/17/2014  . Tdap 03/08/2016   Social History   Social History  . Marital status: Married    Spouse name: N/A  . Number of children: N/A  . Years of education: N/A   Occupational History  . Not on file.   Social History Main Topics  . Smoking status: Former Games developermoker  . Smokeless tobacco: Never Used  . Alcohol use No  . Drug use: No  . Sexual activity: Yes   Other Topics Concern  . Not on file   Social History Narrative  . No narrative on file   Review of Systems  Constitutional: Negative.   HENT: Negative.   Eyes: Negative.   Respiratory: Negative.  Negative for shortness of breath.   Cardiovascular: Negative.  Negative for chest pain.  Gastrointestinal: Negative.   Endocrine: Negative.   Genitourinary: Negative.   Musculoskeletal: Negative.  Negative for myalgias.  Skin: Negative.   Allergic/Immunologic: Negative.   Neurological: Negative.  Negative for focal weakness.  Hematological: Negative.   Psychiatric/Behavioral: Negative.        Objective:   Physical Exam  Constitutional: She is oriented to person, place, and time. She appears well-developed.  HENT:  Head: Normocephalic and atraumatic.  Right Ear: External ear normal.   Left Ear: External ear normal.  Nose: Nose normal.  Mouth/Throat: Oropharynx is clear and moist.  Eyes: Conjunctivae and EOM are normal. Pupils are equal, round, and reactive to light.  Neck: Normal range of motion. Neck supple.  Cardiovascular: Normal rate, regular rhythm, normal heart sounds and intact distal pulses.   Pulmonary/Chest: Effort normal and breath sounds normal.  Abdominal: Soft. Bowel sounds are normal.  Musculoskeletal: Normal range of motion.  Neurological: She is alert and oriented to person, place, and time.  Skin: Skin is warm and dry.  Psychiatric: She has a normal mood and affect. Her behavior is normal. Judgment and thought content normal.      BP 114/64 (BP Location: Left Arm, Patient Position: Sitting, Cuff Size: Normal)   Pulse 64   Temp 98.3 F (36.8 C) (Oral)   Resp 14   Ht 5\' 5"  (1.651 m)   Wt 148 lb (67.1 kg)   SpO2 100%   BMI 24.63 kg/m  Assessment & Plan:  1. Hyperlipidemia, unspecified hyperlipidemia type Recommend a lowfat, low carbohydrate diet divided over 5-6 small meals, increase water intake to 6-8 glasses, and 150 minutes per week of cardiovascular exercise.   - aspirin EC 81 MG tablet; Take 1 tablet (81 mg total) by mouth daily.  Dispense: 30 tablet; Refill: 11 - pravastatin (PRAVACHOL) 40 MG tablet; Take 1 tablet (40 mg total) by mouth daily.  Dispense: 90 tablet; Refill: 3 - Lipid Panel  2. Prediabetes - HgB A1c  3. Need for Tdap vaccination  - Tdap vaccine greater than or equal  to 7yo IM    Preventative maintenance:  Patient will need a colonoscopy Will need to repeat mammogram in 6 months  RTC: 6 months for hyperlipidemia   Massie Maroon, FNP

## 2016-03-08 NOTE — Patient Instructions (Signed)
Centrum Silver Mltivitamin Protein drink recommended (Whey protein) Daily Aspirin

## 2016-04-09 ENCOUNTER — Encounter (HOSPITAL_COMMUNITY): Payer: Self-pay | Admitting: *Deleted

## 2016-04-09 NOTE — Progress Notes (Signed)
Letter mailed to patient with pap smear results.  

## 2016-05-18 MED FILL — PRAVASTATIN NA 40 MG TAB: 40 | 30 days supply | Qty: 30 | Fill #0

## 2016-07-13 MED FILL — PRAVASTATIN NA 40 MG TAB: 40 | 30 days supply | Qty: 30 | Fill #1

## 2016-09-13 ENCOUNTER — Ambulatory Visit: Payer: No Typology Code available for payment source | Admitting: Family Medicine

## 2016-09-15 ENCOUNTER — Other Ambulatory Visit: Payer: Self-pay | Admitting: Family Medicine

## 2016-09-15 DIAGNOSIS — K219 Gastro-esophageal reflux disease without esophagitis: Secondary | ICD-10-CM

## 2016-12-14 ENCOUNTER — Ambulatory Visit: Payer: No Typology Code available for payment source

## 2016-12-21 ENCOUNTER — Ambulatory Visit: Payer: No Typology Code available for payment source

## 2017-01-17 ENCOUNTER — Ambulatory Visit (INDEPENDENT_AMBULATORY_CARE_PROVIDER_SITE_OTHER): Payer: Self-pay | Admitting: Family Medicine

## 2017-01-17 ENCOUNTER — Encounter: Payer: Self-pay | Admitting: Family Medicine

## 2017-01-17 VITALS — BP 124/64 | HR 61 | Temp 98.0°F | Resp 16 | Ht 65.0 in | Wt 159.0 lb

## 2017-01-17 DIAGNOSIS — E785 Hyperlipidemia, unspecified: Secondary | ICD-10-CM

## 2017-01-17 DIAGNOSIS — R7303 Prediabetes: Secondary | ICD-10-CM

## 2017-01-17 LAB — POCT GLYCOSYLATED HEMOGLOBIN (HGB A1C): Hemoglobin A1C: 5.6

## 2017-01-17 NOTE — Progress Notes (Signed)
Subjective:    Patient ID: April Bell, female    DOB: [birth year], 57 y.o.   MRN: [id number]  April Bell, a 57 year old female presents accompanied by her husband and daughter for follow-up of hyperlipidemia.  Patient has been on statin and ASA therapy for greater than 6 months.  She has been taking medications consistently.  She denies a family history of heart disease and does not have a history of hypertension.  Patient has never had an EKG.  She generally follows a balanced diet and remains active.   Hyperlipidemia  This is a chronic problem. The problem is uncontrolled. She has no history of chronic renal disease, diabetes, hypothyroidism, liver disease, obesity or nephrotic syndrome. Factors aggravating her hyperlipidemia include fatty foods. Pertinent negatives include no chest pain, focal sensory loss, focal weakness, leg pain, myalgias or shortness of breath. Current antihyperlipidemic treatment includes diet change. There are no known risk factors for coronary artery disease.   Past Medical History:  Diagnosis Date  . Hyperlipidemia    Immunization History  Administered Date(s) Administered  . Influenza,inj,Quad PF,6+ Mos 09/17/2014, 12/07/2016  . Tdap 03/08/2016   Social History   Socioeconomic History  . Marital status: Married    Spouse name: Not on file  . Number of children: Not on file  . Years of education: Not on file  . Highest education level: Not on file  Social Needs  . Financial resource strain: Not on file  . Food insecurity - worry: Not on file  . Food insecurity - inability: Not on file  . Transportation needs - medical: Not on file  . Transportation needs - non-medical: Not on file  Occupational History  . Not on file  Tobacco Use  . Smoking status: Former Games developer  . Smokeless tobacco: Never Used  Substance and Sexual Activity  . Alcohol use: No    Alcohol/week: 0.0 oz  . Drug use: No  . Sexual activity: Yes  Other Topics Concern  .  Not on file  Social History Narrative  . Not on file   Review of Systems  Constitutional: Negative.   HENT: Negative.   Eyes: Negative.   Respiratory: Negative.  Negative for shortness of breath.   Cardiovascular: Negative.  Negative for chest pain.  Gastrointestinal: Negative.   Endocrine: Negative.   Genitourinary: Negative.   Musculoskeletal: Negative.  Negative for myalgias.  Skin: Negative.   Allergic/Immunologic: Negative.   Neurological: Negative.  Negative for focal weakness.  Hematological: Negative.   Psychiatric/Behavioral: Negative.        Objective:   Physical Exam  Constitutional: She is oriented to person, place, and time. She appears well-developed.  HENT:  Head: Normocephalic and atraumatic.  Right Ear: External ear normal.  Left Ear: External ear normal.  Nose: Nose normal.  Mouth/Throat: Oropharynx is clear and moist.  Eyes: Conjunctivae and EOM are normal. Pupils are equal, round, and reactive to light.  Neck: Normal range of motion. Neck supple.  Cardiovascular: Normal rate, regular rhythm, normal heart sounds and intact distal pulses.  Pulmonary/Chest: Effort normal and breath sounds normal.  Abdominal: Soft. Bowel sounds are normal.  Musculoskeletal: Normal range of motion.  Neurological: She is alert and oriented to person, place, and time.  Skin: Skin is warm and dry.  Psychiatric: She has a normal mood and affect. Her behavior is normal. Judgment and thought content normal.      BP 124/64 (BP Location: Left Arm, Patient Position: Sitting, Cuff Size: Normal)   Pulse  61   Temp 98 F (36.7 C) (Oral)   Resp 16   Ht 5\' 5"  (1.651 m)   Wt 159 lb (72.1 kg)   SpO2 100%   BMI 26.46 kg/m  Assessment & Plan:  1. Hyperlipidemia, unspecified hyperlipidemia type Recommend a lowfat, low carbohydrate diet divided over 5-6 small meals, increase water intake to 6-8 glasses, and 150 minutes per week of cardiovascular exercise.  The 10-year ASCVD risk score  Denman George(Goff [place] Montez HagemanJr., et al., 2013) is: 1.7%   Values used to calculate the score:     Age: [age] years     Sex: Female     Is Non-Hispanic African American: No     Diabetic: No     Tobacco smoker: No     Systolic Blood Pressure: 124 mmHg     Is BP treated: No     HDL Cholesterol: 68 mg/dL     Total Cholesterol: 224 mg/dL  - aspirin EC 81 MG tablet; Take 1 tablet (81 mg total) by mouth daily.  Dispense: 30 tablet; Refill: 11 - pravastatin (PRAVACHOL) 40 MG tablet; Take 1 tablet (40 mg total) by mouth daily.  Dispense: 90 tablet; Refill: 3 - Lipid Panel - CMP and Liver - EKG 12-Lead  2. Prediabetes Recommend a lowfat, low carbohydrate diet divided over 5-6 small meals, increase water intake to 6-8 glasses, and 150 minutes per week of cardiovascular exercise.   - HgB A1c   RTC: 6 months for hyperlipidemia     Nolon NationsLachina Moore Francely Craw  MSN, FNP-C Patient Care [facility] [address]  [place], [place] [id number] [phone]

## 2017-01-17 NOTE — Patient Instructions (Signed)
Continue a low-cholesterol diet divided over 5-6 small meals throughout the day.  We will also continue pravastatin 40 mg every evening with dinner. We will follow-up by phone with any abnormal laboratory results. High Cholesterol High cholesterol is a condition in which the blood has high levels of a white, waxy, fat-like substance (cholesterol). The human body needs small amounts of cholesterol. The liver makes all the cholesterol that the body needs. Extra (excess) cholesterol comes from the food that we eat. Cholesterol is carried from the liver by the blood through the blood vessels. If you have high cholesterol, deposits (plaques) may build up on the walls of your blood vessels (arteries). Plaques make the arteries narrower and stiffer. Cholesterol plaques increase your risk for heart attack and stroke. Work with your health care provider to keep your cholesterol levels in a healthy range. What increases the risk? This condition is more likely to develop in people who:  Eat foods that are high in animal fat (saturated fat) or cholesterol.  Are overweight.  Are not getting enough exercise.  Have a family history of high cholesterol.  What are the signs or symptoms? There are no symptoms of this condition. How is this diagnosed? This condition may be diagnosed from the results of a blood test.  If you are older than age 56, your health care provider may check your cholesterol every 4-6 years.  You may be checked more often if you already have high cholesterol or other risk factors for heart disease.  The blood test for cholesterol measures:  "Bad" cholesterol (LDL cholesterol). This is the main type of cholesterol that causes heart disease. The desired level for LDL is less than 100.  "Good" cholesterol (HDL cholesterol). This type helps to protect against heart disease by cleaning the arteries and carrying the LDL away. The desired level for HDL is 60 or higher.  Triglycerides.  These are fats that the body can store or burn for energy. The desired number for triglycerides is lower than 150.  Total cholesterol. This is a measure of the total amount of cholesterol in your blood, including LDL cholesterol, HDL cholesterol, and triglycerides. A healthy number is less than 200.  How is this treated? This condition is treated with diet changes, lifestyle changes, and medicines. Diet changes  This may include eating more whole grains, fruits, vegetables, nuts, and fish.  This may also include cutting back on red meat and foods that have a lot of added sugar. Lifestyle changes  Changes may include getting at least 40 minutes of aerobic exercise 3 times a week. Aerobic exercises include walking, biking, and swimming. Aerobic exercise along with a healthy diet can help you maintain a healthy weight.  Changes may also include quitting smoking. Medicines  Medicines are usually given if diet and lifestyle changes have failed to reduce your cholesterol to healthy levels.  Your health care provider may prescribe a statin medicine. Statin medicines have been shown to reduce cholesterol, which can reduce the risk of heart disease. Follow these instructions at home: Eating and drinking  If told by your health care provider:  Eat chicken (without skin), fish, veal, shellfish, ground [place]urkey breast, and round or loin cuts of red meat.  Do not eat fried foods or fatty meats, such as hot dogs and salami.  Eat plenty of fruits, such as apples.  Eat plenty of vegetables, such as broccoli, potatoes, and carrots.  Eat beans, peas, and lentils.  Eat grains such as barley, rice,  couscous, and bulgur wheat.  Eat pasta without cream sauces.  Use skim or nonfat milk, and eat low-fat or nonfat yogurt and cheeses.  Do not eat or drink whole milk, cream, ice cream, egg yolks, or hard cheeses.  Do not eat stick margarine or tub margarines that contain trans fats (also called  partially hydrogenated oils).  Do not eat saturated tropical oils, such as coconut oil and palm oil.  Do not eat cakes, cookies, crackers, or other baked goods that contain trans fats.  General instructions  Exercise as directed by your health care provider. Increase your activity level with activities such as gardening, walking, and taking the stairs.  Take over-the-counter and prescription medicines only as told by your health care provider.  Do not use any products that contain nicotine or tobacco, such as cigarettes and e-cigarettes. If you need help quitting, ask your health care provider.  Keep all follow-up visits as told by your health care provider. This is important. Contact a health care provider if:  You are struggling to maintain a healthy diet or weight.  You need help to start on an exercise program.  You need help to stop smoking. Get help right away if:  You have chest pain.  You have trouble breathing. This information is not intended to replace advice given to you by your health care provider. Make sure you discuss any questions you have with your health care provider. Document Released: 12/21/2004 Document Revised: 07/19/2015 Document Reviewed: 06/21/2015 Elsevier Interactive Patient Education  [employer].

## 2017-01-18 LAB — CMP AND LIVER
ALK PHOS: 63 IU/L (ref 39–117)
ALT: 16 IU/L (ref 0–32)
AST: 18 IU/L (ref 0–40)
Albumin: 4.3 g/dL (ref 3.5–5.5)
BILIRUBIN, DIRECT: 0.05 mg/dL (ref 0.00–0.40)
BUN: 9 mg/dL (ref 6–24)
CALCIUM: 9.7 mg/dL (ref 8.7–10.2)
CHLORIDE: 102 mmol/L (ref 96–106)
CO2: 23 mmol/L (ref 20–29)
Creatinine, Ser: 0.67 mg/dL (ref 0.57–1.00)
GFR calc Af Amer: 114 mL/min/{1.73_m2} (ref >59)
GFR calc non Af Amer: 99 mL/min/{1.73_m2} (ref >59)
Glucose: 96 mg/dL (ref 65–99)
Potassium: 4.2 mmol/L (ref 3.5–5.2)
SODIUM: 142 mmol/L (ref 134–144)
Total Protein: 7.2 g/dL (ref 6.0–8.5)

## 2017-01-18 LAB — LIPID PANEL
CHOLESTEROL TOTAL: 257 mg/dL — AB (ref 100–199)
Chol/HDL Ratio: 3 ratio (ref 0.0–4.4)
HDL: 85 mg/dL (ref >39)
LDL CALC: 152 mg/dL — AB (ref 0–99)
TRIGLYCERIDES: 101 mg/dL (ref 0–149)
VLDL CHOLESTEROL CAL: 20 mg/dL (ref 5–40)

## 2017-04-29 ENCOUNTER — Other Ambulatory Visit (HOSPITAL_COMMUNITY): Payer: Self-pay | Admitting: *Deleted

## 2017-04-29 DIAGNOSIS — N6310 Unspecified lump in the right breast, unspecified quadrant: Secondary | ICD-10-CM

## 2017-06-02 ENCOUNTER — Encounter (HOSPITAL_COMMUNITY): Payer: Self-pay

## 2017-06-02 ENCOUNTER — Ambulatory Visit
Admission: RE | Admit: 2017-06-02 | Discharge: 2017-06-02 | Disposition: A | Discharge status: Home / Self Care | Payer: No Typology Code available for payment source | Source: Ambulatory Visit | Attending: Obstetrics and Gynecology | Admitting: Obstetrics and Gynecology

## 2017-06-02 ENCOUNTER — Ambulatory Visit (HOSPITAL_COMMUNITY)
Admission: RE | Admit: 2017-06-02 | Discharge: 2017-06-02 | Disposition: A | Discharge status: Home / Self Care | Payer: Self-pay | Source: Ambulatory Visit | Attending: Obstetrics and Gynecology | Admitting: Obstetrics and Gynecology

## 2017-06-02 VITALS — BP 114/70

## 2017-06-02 DIAGNOSIS — N6310 Unspecified lump in the right breast, unspecified quadrant: Secondary | ICD-10-CM

## 2017-06-02 DIAGNOSIS — Z1239 Encounter for other screening for malignant neoplasm of breast: Secondary | ICD-10-CM

## 2017-06-02 NOTE — Patient Instructions (Signed)
Explained breast self awareness with [name]. Patient did not need a Pap smear today due to last Pap smear and HPV typing was 02/12/2016. Let her know [facility] will cover Pap smears and HPV typing every 5 years unless has a history of abnormal Pap smears. Referred patient to the [facility] for a bilateral breast diagnostic mammogram and possible right breast ultrasound per recommendation. Appointment scheduled for Thursday, Jun 02, 2017 at 1320. April Bell verbalized understanding.  Onis Markoff, Kathaleen Maser, RN 4:53 PM

## 2017-06-02 NOTE — Progress Notes (Signed)
Patient referred to [facility] due to right breast 40-month follow-up was recommended. Patients last right breast diagnostic mammogram was completed 02/12/2016 and last bilateral screening mammogram 12/15/2015.  Pap Smear: Pap smear not completed today. Last Pap smear was 02/12/2016 at [facility] and normal with negative HPV. Per patient has no history of an abnormal Pap smear and her last Pap smear is the only Pap smear she has had. Last Pap smear result is in Epic.  Physical exam: Breasts Breasts symmetrical. No skin abnormalities bilateral breasts. No nipple retraction bilateral breasts. No nipple discharge bilateral breasts. No lymphadenopathy. No lumps palpated bilateral breasts. No complaints of pain or tenderness on exam. Referred patient to the [facility] for a bilateral breast diagnostic mammogram and possible right breast ultrasound per recommendation. Appointment scheduled for Thursday, Jun 02, 2017 at 1320.  Pelvic/Bimanual No Pap smear completed today since last Pap smear and HPV typing was 02/12/2016. Pap smear not indicated per [facility] guidelines.   Smoking History: Patient has never smoked.  Patient Navigation: Patient education provided. Access to services provided for patient through [facility] program. Patient speaks Arabic. Patients daughter Fonnie Birkenhead and patient signed consent for daughter Lodema Pilot to interpret for patient.  Colorectal Cancer Screening: Per patient has never had a colonoscopy completed. No complaints today. FIT Test given to patient to complete and return to [facility].  Breast and Cervical Cancer Risk Assessment: Patient has no family history of breast cancer, known genetic mutations, or radiation treatment to the chest before age 62. Patient has no history of cervical dysplasia, immunocompromised, or DES exposure in-utero. Patient has a 5-year risk of breast cancer at 1% and a lifetime risk at 6.6%

## 2017-06-22 ENCOUNTER — Encounter (HOSPITAL_COMMUNITY): Payer: Self-pay | Admitting: *Deleted

## 2017-07-18 ENCOUNTER — Ambulatory Visit: Payer: Self-pay | Admitting: Family Medicine

## 2017-08-03 ENCOUNTER — Ambulatory Visit: Payer: No Typology Code available for payment source | Attending: Family Medicine

## 2017-08-22 ENCOUNTER — Ambulatory Visit: Payer: No Typology Code available for payment source | Admitting: Family Medicine

## 2017-09-19 ENCOUNTER — Encounter: Payer: Self-pay | Admitting: Family Medicine

## 2017-09-19 ENCOUNTER — Ambulatory Visit (INDEPENDENT_AMBULATORY_CARE_PROVIDER_SITE_OTHER): Payer: Self-pay | Admitting: Family Medicine

## 2017-09-19 VITALS — BP 147/88 | HR 66 | Temp 98.7°F | Resp 16 | Ht 65.0 in | Wt 158.0 lb

## 2017-09-19 DIAGNOSIS — H6993 Unspecified Eustachian tube disorder, bilateral: Secondary | ICD-10-CM

## 2017-09-19 DIAGNOSIS — R7303 Prediabetes: Secondary | ICD-10-CM

## 2017-09-19 DIAGNOSIS — R5383 Other fatigue: Secondary | ICD-10-CM

## 2017-09-19 DIAGNOSIS — J0101 Acute recurrent maxillary sinusitis: Secondary | ICD-10-CM

## 2017-09-19 DIAGNOSIS — Z23 Encounter for immunization: Secondary | ICD-10-CM

## 2017-09-19 LAB — POCT GLYCOSYLATED HEMOGLOBIN (HGB A1C): Hemoglobin A1C: 5.2 % (ref 4.0–5.6)

## 2017-09-19 MED ORDER — CETIRIZINE HCL 10 MG PO TABS: 10.0000 mg | ORAL_TABLET | Freq: Every day | ORAL | 11 refills | Status: DC

## 2017-09-19 MED ORDER — FLUTICASONE PROPIONATE 50 MCG/ACT NA SUSP: 2.0000 | Freq: Every day | NASAL | 6 refills | Status: DC

## 2017-09-19 MED ORDER — CEPHALEXIN 500 MG PO CAPS: 500.0000 mg | ORAL_CAPSULE | Freq: Two times a day (BID) | ORAL | 0 refills | Status: AC

## 2017-09-19 MED FILL — FLUTICASONE PROP 50 MCG SPR: 50 | 30 days supply | Qty: 16 | Fill #0

## 2017-09-19 MED FILL — CEPHALEXIN 500 MG CAPSULE: 500 | 7 days supply | Qty: 14 | Fill #0

## 2017-09-19 NOTE — Patient Instructions (Signed)
Sinusitis, Adult  Eustachian Tube Dysfunction The eustachian tube connects the middle ear to the back of the nose. It regulates air pressure in the middle ear by allowing air to move between the ear and nose. It also helps to drain fluid from the middle ear space. When the eustachian tube does not function properly, air pressure, fluid, or both can build up in the middle ear. Eustachian tube dysfunction can affect one or both ears. What are the causes? This condition happens when the eustachian tube becomes blocked or cannot open normally. This may result from:  Ear infections.  Colds and other upper respiratory infections.  Allergies.  Irritation, such as from cigarette smoke or acid from the stomach coming up into the esophagus (gastroesophageal reflux).  Sudden changes in air pressure, such as from descending in an airplane.  Abnormal growths in the nose or throat, such as nasal polyps, tumors, or enlarged tissue at the back of the throat (adenoids).  What increases the risk? This condition may be more likely to develop in people who smoke and people who are overweight. Eustachian tube dysfunction may also be more likely to develop in children, especially children who have:  Certain birth defects of the mouth, such as cleft palate.  Large tonsils and adenoids.  What are the signs or symptoms? Symptoms of this condition may include:  A feeling of fullness in the ear.  Ear pain.  Clicking or popping noises in the ear.  Ringing in the ear.  Hearing loss.  Loss of balance.  Symptoms may get worse when the air pressure around you changes, such as when you travel to an area of high elevation or fly on an airplane. How is this diagnosed? This condition may be diagnosed based on:  Your symptoms.  A physical exam of your ear, nose, and throat.  Tests, such as those that measure: ? The movement of your eardrum (tympanogram). ? Your hearing (audiometry).  How is this  treated? Treatment depends on the cause and severity of your condition. If your symptoms are mild, you may be able to relieve your symptoms by moving air into ("popping") your ears. If you have symptoms of fluid in your ears, treatment may include:  Decongestants.  Antihistamines.  Nasal sprays or ear drops that contain medicines that reduce swelling (steroids).  In some cases, you may need to have a procedure to drain the fluid in your eardrum (myringotomy). In this procedure, a small tube is placed in the eardrum to:  Drain the fluid.  Restore the air in the middle ear space.  Follow these instructions at home:  Take over-the-counter and prescription medicines only as told by your health care provider.  Use techniques to help pop your ears as recommended by your health care provider. These may include: ? Chewing gum. ? Yawning. ? Frequent, forceful swallowing. ? Closing your mouth, holding your nose closed, and gently blowing as if you are trying to blow air out of your nose.  Do not do any of the following until your health care provider approves: ? Travel to high altitudes. ? Fly in airplanes. ? Work in a Estate agentpressurized cabin or room. ? Scuba dive.  Keep your ears dry. Dry your ears completely after showering or bathing.  Do not smoke.  Keep all follow-up visits as told by your health care provider. This is important. Contact a health care provider if:  Your symptoms do not go away after treatment.  Your symptoms come back after treatment.  You are unable to pop your ears.  You have: ? A fever. ? Pain in your ear. ? Pain in your head or neck. ? Fluid draining from your ear.  Your hearing suddenly changes.  You become very dizzy.  You lose your balance. This information is not intended to replace advice given to you by your health care provider. Make sure you discuss any questions you have with your health care provider. Document Released: 01/17/2015 Document  Revised: 05/29/2015 Document Reviewed: 01/09/2014 Elsevier Interactive Patient Education  2018 [employer]. Sinusitis is soreness and inflammation of your sinuses. Sinuses are hollow spaces in the bones around your face. They are located:  Around your eyes.  In the middle of your forehead.  Behind your nose.  In your cheekbones.  Your sinuses and nasal passages are lined with a stringy fluid (mucus). Mucus normally drains out of your sinuses. When your nasal tissues get inflamed or swollen, the mucus can get trapped or blocked so air cannot flow through your sinuses. This lets bacteria, viruses, and funguses grow, and that leads to infection. Follow these instructions at home: Medicines  Take, use, or apply over-the-counter and prescription medicines only as told by your doctor. These may include nasal sprays.  If you were prescribed an antibiotic medicine, take it as told by your doctor. Do not stop taking the antibiotic even if you start to feel better. Hydrate and Humidify  Drink enough water to keep your pee (urine) clear or pale yellow.  Use a cool mist humidifier to keep the humidity level in your home above 50%.  Breathe in steam for 10-15 minutes, 3-4 times a day or as told by your doctor. You can do this in the bathroom while a hot shower is running.  Try not to spend time in cool or dry air. Rest  Rest as much as possible.  Sleep with your head raised (elevated).  Make sure to get enough sleep each night. General instructions  Put a warm, moist washcloth on your face 3-4 times a day or as told by your doctor. This will help with discomfort.  Wash your hands often with soap and water. If there is no soap and water, use hand sanitizer.  Do not smoke. Avoid being around people who are smoking (secondhand smoke).  Keep all follow-up visits as told by your doctor. This is important. Contact a doctor if:  You have a fever.  Your symptoms get worse.  Your  symptoms do not get better within 10 days. Get help right away if:  You have a very bad headache.  You cannot stop throwing up (vomiting).  You have pain or swelling around your face or eyes.  You have trouble seeing.  You feel confused.  Your neck is stiff.  You have trouble breathing. This information is not intended to replace advice given to you by your health care provider. Make sure you discuss any questions you have with your health care provider. Document Released: 06/09/2007 Document Revised: 08/17/2015 Document Reviewed: 10/16/2014 Elsevier Interactive Patient Education  [employer].

## 2017-09-19 NOTE — Progress Notes (Signed)
[facility]   Progress Note: General Provider: Mike GipAndre Dominyck Reser, FNP  SUBJECTIVE:   April Bell is a 57 y.o. female who  has a past medical history of Hyperlipidemia.. Patient presents today for Hyperlipidemia and Follow-up (prediabetes )  Patient states that she feels tired all the time and has headaches x 1 month. Patient states that she has had some nasal congestion. + itchy eyes.  Review of Systems  Constitutional: Negative.   HENT: Positive for ear pain (congestion) and sinus pain.   Eyes: Negative.   Respiratory: Negative.   Cardiovascular: Negative.   Gastrointestinal: Negative.   Genitourinary: Negative.   Musculoskeletal: Negative.   Skin: Negative.   Neurological: Positive for headaches.  Endo/Heme/Allergies: Positive for environmental allergies.  Psychiatric/Behavioral: Negative.      OBJECTIVE: BP (!) 147/88 (BP Location: Left Arm, Patient Position: Sitting, Cuff Size: Normal)   Pulse 66   Temp 98.7 F (37.1 C) (Oral)   Resp 16   Ht 5\' 5"  (1.651 m)   Wt 158 lb (71.7 kg)   SpO2 100%   BMI 26.29 kg/m   Physical Exam  Constitutional: She is oriented to person, place, and time. She appears well-developed and well-nourished. No distress.  HENT:  Head: Normocephalic and atraumatic.  Right Ear: Hearing normal. A middle ear effusion is present.  Left Ear: Hearing normal. A middle ear effusion is present.  Nose: Mucosal edema and rhinorrhea present. Right sinus exhibits no maxillary sinus tenderness and no frontal sinus tenderness. Left sinus exhibits no maxillary sinus tenderness and no frontal sinus tenderness.  Mouth/Throat: Uvula is midline and mucous membranes are normal. Oropharyngeal exudate present.  Eyes: Pupils are equal, round, and reactive to light. Conjunctivae and EOM are normal.  Neck: Normal range of motion.  Cardiovascular: Normal rate, regular rhythm, normal heart sounds and intact distal pulses.    Pulmonary/Chest: Effort normal and breath sounds normal. No respiratory distress.  Abdominal: Soft. Bowel sounds are normal. She exhibits no distension.  Musculoskeletal: Normal range of motion.  Neurological: She is alert and oriented to person, place, and time.  Skin: Skin is warm and dry.  Psychiatric: She has a normal mood and affect. Her behavior is normal. Thought content normal.  Nursing note and vitals reviewed.   ASSESSMENT/PLAN:   1. Flu vaccine need Influenza vaccination given in the office visit today. - Flu Vaccine QUAD 6+ mos PF IM (Fluarix Quad PF)  2. Prediabetes The patient is asked to make an attempt to improve diet and exercise patterns to aid in medical management of this problem. - HgB A1c - CBC with Differential - Comprehensive metabolic panel  3. Fatigue, unspecified type Labs ordered - CBC with Differential - Comprehensive metabolic panel - Thyroid Panel With TSH - Iron, TIBC and Ferritin Panel  4. Acute recurrent maxillary sinusitis - cetirizine (ZYRTEC) 10 MG tablet; Take 1 tablet (10 mg total) by mouth daily.  Dispense: 30 tablet; Refill: 11 - fluticasone (FLONASE) 50 MCG/ACT nasal spray; Place 2 sprays into both nostrils daily.  Dispense: 16 g; Refill: 6 - cephALEXin (KEFLEX) 500 MG capsule; Take 1 capsule (500 mg total) by mouth 2 (two) times daily for 7 days.  Dispense: 14 capsule; Refill: 0  5. Eustachian tube disorder, bilateral - cetirizine (ZYRTEC) 10 MG tablet; Take 1 tablet (10 mg total) by mouth daily.  Dispense: 30 tablet; Refill: 11 - fluticasone (FLONASE) 50 MCG/ACT nasal spray; Place 2 sprays into both nostrils daily.  Dispense:  16 g; Refill: 6       The patient was given clear instructions to go to ER or return to medical center if symptoms do not improve, worsen or new problems develop. The patient verbalized understanding and agreed with plan of care.   Ms. April Bell. April Lam, FNP-BC [facility] [address] [place], [place] [address] [phone]     This note has been created with Dragon speech recognition software and smart phrase technology. Any transcriptional errors are unintentional.

## 2017-09-20 LAB — COMPREHENSIVE METABOLIC PANEL
ALT: 15 IU/L (ref 0–32)
AST: 18 IU/L (ref 0–40)
Albumin/Globulin Ratio: 1.5 (ref 1.2–2.2)
Albumin: 4.3 g/dL (ref 3.5–5.5)
Alkaline Phosphatase: 60 IU/L (ref 39–117)
BUN/Creatinine Ratio: 13 (ref 9–23)
BUN: 9 mg/dL (ref 6–24)
Bilirubin Total: <0.2 mg/dL (ref 0.0–1.2)
CO2: 22 mmol/L (ref 20–29)
Calcium: 9.7 mg/dL (ref 8.7–10.2)
Chloride: 105 mmol/L (ref 96–106)
Creatinine, Ser: 0.7 mg/dL (ref 0.57–1.00)
GFR calc Af Amer: 112 mL/min/{1.73_m2} (ref >59)
GFR calc non Af Amer: 97 mL/min/{1.73_m2} (ref >59)
Globulin, Total: 2.8 g/dL (ref 1.5–4.5)
Glucose: 113 mg/dL — ABNORMAL HIGH (ref 65–99)
Potassium: 4.3 mmol/L (ref 3.5–5.2)
Sodium: 141 mmol/L (ref 134–144)
Total Protein: 7.1 g/dL (ref 6.0–8.5)

## 2017-09-20 LAB — CBC WITH DIFFERENTIAL/PLATELET
Basophils Absolute: 0 10*3/uL (ref 0.0–0.2)
Basos: 1 %
EOS (ABSOLUTE): 0.1 10*3/uL (ref 0.0–0.4)
Eos: 3 %
Hematocrit: 40.5 % (ref 34.0–46.6)
Hemoglobin: 13.3 g/dL (ref 11.1–15.9)
Immature Grans (Abs): 0 10*3/uL (ref 0.0–0.1)
Immature Granulocytes: 0 %
Lymphocytes Absolute: 2.2 10*3/uL (ref 0.7–3.1)
Lymphs: 60 %
MCH: 29.5 pg (ref 26.6–33.0)
MCHC: 32.8 g/dL (ref 31.5–35.7)
MCV: 90 fL (ref 79–97)
Monocytes Absolute: 0.3 10*3/uL (ref 0.1–0.9)
Monocytes: 9 %
Neutrophils Absolute: 1 10*3/uL — ABNORMAL LOW (ref 1.4–7.0)
Neutrophils: 27 %
Platelets: 231 10*3/uL (ref 150–450)
RBC: 4.51 x10E6/uL (ref 3.77–5.28)
RDW: 12.5 % (ref 12.3–15.4)
WBC: 3.6 10*3/uL (ref 3.4–10.8)

## 2017-09-20 LAB — IRON,TIBC AND FERRITIN PANEL
Ferritin: 93 ng/mL (ref 15–150)
Iron Saturation: 31 % (ref 15–55)
Iron: 68 ug/dL (ref 27–159)
Total Iron Binding Capacity: 221 ug/dL — ABNORMAL LOW (ref 250–450)
UIBC: 153 ug/dL (ref 131–425)

## 2017-09-20 LAB — THYROID PANEL WITH TSH
Free Thyroxine Index: 2.1 (ref 1.2–4.9)
T3 Uptake Ratio: 27 % (ref 24–39)
T4, Total: 7.7 ug/dL (ref 4.5–12.0)
TSH: 1.07 u[IU]/mL (ref 0.450–4.500)

## 2017-09-23 ENCOUNTER — Other Ambulatory Visit: Payer: Self-pay | Admitting: Family Medicine

## 2017-09-23 DIAGNOSIS — E785 Hyperlipidemia, unspecified: Secondary | ICD-10-CM

## 2017-09-23 MED FILL — PRAVASTATIN NA 40 MG TAB: 40 | 30 days supply | Qty: 30 | Fill #0

## 2018-01-30 ENCOUNTER — Ambulatory Visit (INDEPENDENT_AMBULATORY_CARE_PROVIDER_SITE_OTHER): Payer: No Typology Code available for payment source | Admitting: Family Medicine

## 2018-01-30 VITALS — BP 147/73 | HR 62 | Temp 98.4°F | Resp 16 | Ht 65.0 in | Wt 157.0 lb

## 2018-01-30 DIAGNOSIS — M2550 Pain in unspecified joint: Secondary | ICD-10-CM

## 2018-01-30 MED ORDER — NAPROXEN 500 MG PO TABS: 500.0000 mg | ORAL_TABLET | Freq: Two times a day (BID) | ORAL | 0 refills | Status: DC

## 2018-01-30 MED FILL — NAPROXEN 500 MG TABLET: 500 | 15 days supply | Qty: 30 | Fill #0

## 2018-01-30 NOTE — Patient Instructions (Signed)
I sent naprosyn to the pharmacy. You will take this 2 times per day to help with pain and swelling. Please take this with food.   Joint Pain  Joint pain can be caused by many things. It is likely to go away if you follow instructions from your doctor for taking care of yourself at home. Sometimes, you may need more treatment. Follow these instructions at home: Managing pain, stiffness, and swelling   If told, put ice on the painful area. ? Put ice in a plastic bag. ? Place a towel between your skin and the bag. ? Leave the ice on for 20 minutes, 2-3 times a day.  If told, put heat on the painful area. Do this as often as told by your doctor. Use the heat source that your doctor recommends, such as a moist heat pack or a heating pad. ? Place a towel between your skin and the heat source. ? Leave the heat on for 20-30 minutes. ? Take off the heat if your skin gets bright red. This is especially important if you are unable to feel pain, heat, or cold. You may have a greater risk of getting burned.  Move your fingers or toes below the painful joint often. This helps with stiffness and swelling.  If possible, raise (elevate) the painful joint above the level of your heart while you are sitting or lying down. To do this, try putting a few pillows under the painful joint. Activity  Rest the painful joint for as long as told. Do not do things that cause pain or make your pain worse.  Begin exercising or stretching the affected area, as told by your doctor. Ask your doctor what types of exercise are safe for you. If you have an elastic bandage, sling, or splint:  Wear the device as told by your doctor. Take it off only as told by your doctor.  Loosen the device if your fingers or toes below the joint: ? Tingle. ? Lose feeling (get numb). ? Get cold and blue.  Keep the device clean.  Ask your doctor if you should take off the device before bathing. You may need to cover it with a  watertight covering when you take a bath or a shower. General instructions  Take over-the-counter and prescription medicines only as told by your doctor.  Do not use any products that contain nicotine or tobacco. These include cigarettes and e-cigarettes. If you need help quitting, ask your doctor.  Keep all follow-up visits as told by your doctor. This is important. Contact a doctor if:  You have pain that gets worse and does not get better with medicine.  Your joint pain does not get better in 3 days.  You have more bruising or swelling.  You have a fever.  You lose 10 lb (4.5 kg) or more without trying. Get help right away if:  You cannot move the joint.  Your fingers or toes tingle, lose feeling, or get cold and blue.  You have a fever along with a joint that is red, warm, and swollen. Summary  Joint pain can be caused by many things. It often goes away if you follow instructions from your doctor for taking care of yourself at home.  Rest the painful joint for as long as told. Do not do things that cause pain or make your pain worse.  Take over-the-counter and prescription medicines only as told by your doctor. This information is not intended to replace advice given  to you by your health care provider. Make sure you discuss any questions you have with your health care provider. Document Released: 12/09/2008 Document Revised: 10/06/2016 Document Reviewed: 10/06/2016 Elsevier Interactive Patient Education  2019 [employer].

## 2018-01-30 NOTE — Progress Notes (Signed)
  [facility]   Progress Note: General Provider: Mike Gip, FNP  SUBJECTIVE:   April Bell is a 58 y.o. female who  has a past medical history of Hyperlipidemia.. Patient presents today for Joint Swelling (started 2 weeks ago )  Patient presents today with complaints of joint swelling that started 2 weeks ago.Patient states she is having swelling in multiple joints including her right foot left elbow and bilateral knees.  She states that she takes ibuprofen at night to help with severe pain.  She denies injury or recent illness.  She denies a history of autoimmune disorders or rheumatoid arthritis.    Review of Systems  Constitutional: Negative.   HENT: Negative.   Eyes: Negative.   Respiratory: Negative.   Cardiovascular: Negative.   Gastrointestinal: Negative.   Genitourinary: Negative.   Musculoskeletal: Positive for joint pain.  Skin: Negative.   Neurological: Negative.   Psychiatric/Behavioral: Negative.      OBJECTIVE: BP (!) 147/73 (BP Location: Right Arm, Patient Position: Sitting, Cuff Size: Normal)   Pulse 62   Temp 98.4 F (36.9 C) (Oral)   Resp 16   Ht 5\' 5"  (1.651 m)   Wt 157 lb (71.2 kg)   SpO2 100%   BMI 26.13 kg/m   Wt Readings from Last 3 Encounters:  01/30/18 157 lb (71.2 kg)  09/19/17 158 lb (71.7 kg)  01/17/17 159 lb (72.1 kg)     Physical Exam Vitals signs and nursing note reviewed.  Constitutional:      General: She is not in acute distress.    Appearance: Normal appearance.  HENT:     Head: Normocephalic and atraumatic.  Musculoskeletal:     Left elbow: She exhibits swelling.     Left knee: She exhibits swelling.     Right ankle: She exhibits swelling.  Skin:    General: Skin is warm and dry.  Neurological:     Mental Status: She is alert and oriented to person, place, and time.  Psychiatric:        Mood and Affect: Mood normal.        Behavior: Behavior normal.      Thought Content: Thought content normal.        Judgment: Judgment normal.     ASSESSMENT/PLAN:   1. Arthralgia, unspecified joint Pending labs. Will adjust medications accordingly.    - naproxen (NAPROSYN) 500 MG tablet; Take 1 tablet (500 mg total) by mouth 2 (two) times daily with a meal.  Dispense: 30 tablet; Refill: 0 - Arthritis Panel - ANA,IFA RA Diag Pnl w/rflx Tit/Patn - Sedimentation Rate - C-reactive protein - Uric Acid   Due to language barrier, an interpreter was present during the history-taking and subsequent discussion (and for part of the physical exam) with this patient.   Time Spent: 25 minutes face-to-face with this patient discussing problems, treatments, and answering patient's questions.   Return in about 1 week (around 02/06/2018).    The patient was given clear instructions to go to ER or return to medical center if symptoms do not improve, worsen or new problems develop. The patient verbalized understanding and agreed with plan of care.   Ms. April Bell. April Lam, FNP-BC [facility] [address] [place], [place] [address] [phone]

## 2018-02-01 ENCOUNTER — Telehealth: Payer: Self-pay

## 2018-02-01 LAB — ANA,IFA RA DIAG PNL W/RFLX TIT/PATN
ANA Titer 1: NEGATIVE
Cyclic Citrullin Peptide Ab: 7 units (ref 0–19)

## 2018-02-01 LAB — ARTHRITIS PANEL
Anti Nuclear Antibody(ANA): NEGATIVE
Rhuematoid fact SerPl-aCnc: <10 IU/mL (ref 0.0–13.9)
Sed Rate: 53 mm/hr — ABNORMAL HIGH (ref 0–40)
Uric Acid: 3.1 mg/dL (ref 2.5–7.1)

## 2018-02-01 LAB — C-REACTIVE PROTEIN: CRP: 8 mg/L (ref 0–10)

## 2018-02-01 NOTE — Telephone Encounter (Signed)
-----   Message from Mike Gip, FNP sent at 02/01/2018 10:05 AM EST ----- Please inform patient that there was no evidence of gout, rheumatoid arthritis, or auto immune disorders.  Please continue with the nonsteroidal anti-inflammatory drops that were given  for pain and swelling.

## 2018-02-01 NOTE — Telephone Encounter (Signed)
Called using pacific interpreters [phone]. Spoke with patient. Advised that no evidence of gout, ra, or autho immune disorders. Asked that she continue nonsteroidal anti-inflammatory and keep next scheduled appointment. Thanks!

## 2018-02-06 ENCOUNTER — Encounter: Payer: Self-pay | Admitting: Family Medicine

## 2018-02-06 ENCOUNTER — Ambulatory Visit (INDEPENDENT_AMBULATORY_CARE_PROVIDER_SITE_OTHER): Payer: No Typology Code available for payment source | Admitting: Family Medicine

## 2018-02-06 ENCOUNTER — Ambulatory Visit (HOSPITAL_COMMUNITY)
Admission: RE | Admit: 2018-02-06 | Discharge: 2018-02-06 | Disposition: A | Discharge status: Home / Self Care | Payer: No Typology Code available for payment source | Source: Ambulatory Visit | Attending: Family Medicine | Admitting: Family Medicine

## 2018-02-06 VITALS — BP 137/78 | HR 63 | Temp 98.2°F | Resp 16 | Ht 65.0 in | Wt 157.0 lb

## 2018-02-06 DIAGNOSIS — M2550 Pain in unspecified joint: Secondary | ICD-10-CM | POA: Insufficient documentation

## 2018-02-06 MED ORDER — METHYLPREDNISOLONE 4 MG PO TBPK: ORAL_TABLET | ORAL | 0 refills | Status: DC

## 2018-02-06 MED FILL — METHYLPREDNISOLONE 4 MG TBP: 4 | 6 days supply | Qty: 21 | Fill #0

## 2018-02-06 NOTE — Progress Notes (Signed)
  [facility]   Progress Note: General Provider: Mike Gip, FNP  SUBJECTIVE:   April Bell is a 58 y.o. female who  has a past medical history of Hyperlipidemia.. Patient presents today for Follow-up (joint pain ) Patient presents for follow-up of joint pain to the left knee and right ankle.  Labs were normal and did not show autoimmune disorder.  Daughter is the interpreter and states that the mom has been taking naproxen without relief.  She denies numbness or tingling of the area.  Patient states that the pain goes across her patella intermittently and she can hear a popping noise when she walks.  She denies injury to her left knee or right ankle.  Review of Systems  Constitutional: Negative.   HENT: Negative.   Eyes: Negative.   Respiratory: Negative.   Cardiovascular: Negative.   Gastrointestinal: Negative.   Genitourinary: Negative.   Musculoskeletal: Positive for joint pain. Negative for falls.  Skin: Negative.   Neurological: Negative.   Psychiatric/Behavioral: Negative.      OBJECTIVE: BP 137/78 (BP Location: Right Arm, Patient Position: Sitting, Cuff Size: Large)   Pulse 63   Temp 98.2 F (36.8 C) (Oral)   Resp 16   Ht 5\' 5"  (1.651 m)   Wt 157 lb (71.2 kg)   SpO2 100%   BMI 26.13 kg/m   Wt Readings from Last 3 Encounters:  02/06/18 157 lb (71.2 kg)  01/30/18 157 lb (71.2 kg)  09/19/17 158 lb (71.7 kg)     Physical Exam Vitals signs and nursing note reviewed.  Constitutional:      General: She is not in acute distress.    Appearance: Normal appearance.  HENT:     Head: Normocephalic and atraumatic.  Musculoskeletal:     Left knee: She exhibits swelling. She exhibits no erythema and normal alignment. No tenderness found.     Right ankle: She exhibits swelling. No tenderness.  Skin:    General: Skin is warm and dry.  Neurological:     Mental Status: She is alert and oriented to person, place, and  time.  Psychiatric:        Mood and Affect: Mood normal.        Behavior: Behavior normal.        Thought Content: Thought content normal.        Judgment: Judgment normal.     ASSESSMENT/PLAN:  1. Pain in joint, multiple sites Labs ordered. Will refer to ortho for further evaluation pending imaging.  Discussed with patient and daughter that varicose veins can also cause leg pain.  Advised patient to wear supportive hose and knee brace - DG Knee Complete 4 Views Left; Future - DG Ankle Complete Right; Future - methylPREDNISolone (MEDROL DOSEPAK) 4 MG TBPK tablet; Take as directed  Dispense: 21 tablet; Refill: 0      Return if symptoms worsen or fail to improve.    The patient was given clear instructions to go to ER or return to medical center if symptoms do not improve, worsen or new problems develop. The patient verbalized understanding and agreed with plan of care.   [name]. [name], FNP-BC [facility] [address] [place], [place] [address] [phone]

## 2018-02-06 NOTE — Patient Instructions (Signed)
Take tylenol with the steroid. You can restart naproxen or ibuprofen when you finish the steroids    Joint Pain  Joint pain can be caused by many things. It is likely to go away if you follow instructions from your doctor for taking care of yourself at home. Sometimes, you may need more treatment. Follow these instructions at home: Managing pain, stiffness, and swelling   If told, put ice on the painful area. ? Put ice in a plastic bag. ? Place a towel between your skin and the bag. ? Leave the ice on for 20 minutes, 2-3 times a day.  If told, put heat on the painful area. Do this as often as told by your doctor. Use the heat source that your doctor recommends, such as a moist heat pack or a heating pad. ? Place a towel between your skin and the heat source. ? Leave the heat on for 20-30 minutes. ? Take off the heat if your skin gets bright red. This is especially important if you are unable to feel pain, heat, or cold. You may have a greater risk of getting burned.  Move your fingers or toes below the painful joint often. This helps with stiffness and swelling.  If possible, raise (elevate) the painful joint above the level of your heart while you are sitting or lying down. To do this, try putting a few pillows under the painful joint. Activity  Rest the painful joint for as long as told. Do not do things that cause pain or make your pain worse.  Begin exercising or stretching the affected area, as told by your doctor. Ask your doctor what types of exercise are safe for you. If you have an elastic bandage, sling, or splint:  Wear the device as told by your doctor. Take it off only as told by your doctor.  Loosen the device if your fingers or toes below the joint: ? Tingle. ? Lose feeling (get numb). ? Get cold and blue.  Keep the device clean.  Ask your doctor if you should take off the device before bathing. You may need to cover it with a watertight covering when you take a  bath or a shower. General instructions  Take over-the-counter and prescription medicines only as told by your doctor.  Do not use any products that contain nicotine or tobacco. These include cigarettes and e-cigarettes. If you need help quitting, ask your doctor.  Keep all follow-up visits as told by your doctor. This is important. Contact a doctor if:  You have pain that gets worse and does not get better with medicine.  Your joint pain does not get better in 3 days.  You have more bruising or swelling.  You have a fever.  You lose 10 lb (4.5 kg) or more without trying. Get help right away if:  You cannot move the joint.  Your fingers or toes tingle, lose feeling, or get cold and blue.  You have a fever along with a joint that is red, warm, and swollen. Summary  Joint pain can be caused by many things. It often goes away if you follow instructions from your doctor for taking care of yourself at home.  Rest the painful joint for as long as told. Do not do things that cause pain or make your pain worse.  Take over-the-counter and prescription medicines only as told by your doctor. This information is not intended to replace advice given to you by your health care provider. Make  sure you discuss any questions you have with your health care provider. Document Released: 12/09/2008 Document Revised: 10/06/2016 Document Reviewed: 10/06/2016 Elsevier Interactive Patient Education  2019 [employer].

## 2018-02-08 ENCOUNTER — Telehealth: Payer: Self-pay

## 2018-02-08 NOTE — Telephone Encounter (Signed)
Called and spoke with patient using [employer]pre[employer] ID [phone]. Advised that knee showed showed arthritis and that there is a small metal foreign body on the xray. She states she has not had any surgery on knee replacement and that we can refer her to ortho for this. She verbalized understanding. Thanks!

## 2018-02-08 NOTE — Addendum Note (Signed)
Addended by: Reatha Harps on: 02/08/2018 08:09 AM   Modules accepted: Orders

## 2018-02-08 NOTE — Telephone Encounter (Signed)
-----   Message from Mike GipAndre Douglas, FNP sent at 02/08/2018  8:08 AM EST ----- Please let patient know that her knee showed arthritis. There was also a small metal foreign body on the xray. Please ask if she has had a previous knee surgery, such as a knee replacement. I can refer to ortho for further evaluation and treatment.

## 2018-02-09 ENCOUNTER — Other Ambulatory Visit: Payer: Self-pay | Admitting: Family Medicine

## 2018-02-09 DIAGNOSIS — M25569 Pain in unspecified knee: Secondary | ICD-10-CM

## 2018-02-09 NOTE — Progress Notes (Signed)
referred

## 2018-02-10 ENCOUNTER — Ambulatory Visit: Payer: No Typology Code available for payment source

## 2018-02-13 ENCOUNTER — Ambulatory Visit: Payer: Self-pay | Attending: Family Medicine

## 2018-02-17 ENCOUNTER — Encounter: Payer: Self-pay | Admitting: Sports Medicine

## 2018-02-17 ENCOUNTER — Ambulatory Visit (INDEPENDENT_AMBULATORY_CARE_PROVIDER_SITE_OTHER): Payer: Self-pay | Admitting: Sports Medicine

## 2018-02-17 VITALS — BP 140/80 | Ht 66.54 in | Wt 154.3 lb

## 2018-02-17 DIAGNOSIS — M25571 Pain in right ankle and joints of right foot: Secondary | ICD-10-CM

## 2018-02-17 DIAGNOSIS — M25522 Pain in left elbow: Secondary | ICD-10-CM

## 2018-02-17 DIAGNOSIS — M25562 Pain in left knee: Secondary | ICD-10-CM

## 2018-02-17 MED ORDER — METHYLPREDNISOLONE ACETATE 40 MG/ML IJ SUSP
40.0000 mg | Freq: Once | INTRAMUSCULAR | Status: AC
  Administered 2018-02-17: 40 mg via INTRA_ARTICULAR

## 2018-02-17 NOTE — Progress Notes (Signed)
Subjective:     Patient ID: April Bell, female   DOB: Feb 26, 1960, 58 y.o.   MRN: [id number]  HPI April Bell presents for 3 weeks of joint pain. She reports that 3 weeks ago, her L knee and R ankle began swelling and having pain. The knee pain occurs in the anterior portion of the knee only with flexion and extension, and in the posterior portion of the knee at all times. She has a hard time walking as a result of this pain, and has stopped doing her 45 minutes of daily walks. She has pain with ascending and descending stairs as well, ascending is worse. The ankle pain is in the posterior aspect of the joint. Then 1.5 weeks ago she started having medial L elbow pain and swelling. She saw her PCP who ordered X-rays and labs, and gave her a steroid taper which she says helped her pain, especially in the L elbow, and significantly improved the swelling of her ankle. The swelling in the ankle and the elbow pain have both slowly worsened again, however. Currently, the ankle is the most troublesome for her. She has not had any fevers, chills, or recent infections. She has no past medical history including no history of gout or arthritis. FHx is notable for a sister with Rheumatoid Arthritis.  She denies any recent hiking or finding any ticks on her, and she is not sexually active.  Review of Systems As above.  Past medical history reviewed and updated as necessary. Medications reviewed and updated as necessary. Allergies reviewed and updated as necessary. Family history reviewed and updated as necessary. Surgical history reviewed and updated as necessary.     Objective:   Physical Exam  L elbow: slight swelling of the medial aspect of the elbow, without erythema, ecchymosis, or warmth; tenderness to palpation diffusely over the medial condyle, no tenderness to palpation over lateral epicondyle, olecranon, or biceps tendon; full ROM unlimited by pain or edema; full strength; negative valgus and varus  stress  R elbow: no swelling, without erythema, ecchymosis, or warmth; no tenderness to palpation over the medial condyle, lateral epicondyle, olecranon, or biceps tendon; full ROM unlimited by pain or edema  L knee: no obvious effusion or soft tissue swelling, no erythema, ecchymosis, or warmth; tenderness to palpation over the inferior aspect of the patella and tibial tuberosity and tenderness to palpation of the posterior knee with palpable Baker cyst; no tenderness to palpation over the quad tendon, elsewhere on the patella, or along the medial or lateral joint line; full ROM with pain noted at extremes of ROM in flexion and extension. normal flexion and extension strength; negative varus and valgus stress, negative anterior and posterior drawer, negative patellar grind; neurovascularly intact  R knee: no obvious effusion or soft tissue swelling, no erythema or warmth; no tenderness to palpation  L ankle: no effusion, erythema, ecchymosis, or warmth; no tenderness to palpation; full ROM without pain; full strength in plantarflexion and dorsalflexion  R ankle: significant effusion over the lateral malleolus without erythema, ecchymosis, or warmth; tenderness to palpation superior to the lateral malleolus as well as along the Achilles' tendon and at the insertion point of the tendon on the calcaneus; no tenderness to palpation on the plantar aspect of the calcaneus, along any of the metatarsals, or toes; full ROM with pain at the posterior calcaneus and Achilles' tendon on dorsalflexion of the ankle; full strength of plantarflexion and dorsalflexion; negative anterior drawer, negative CF and TF stress; palpable DP pulse  MSK [place]: Limited ultrasound evaluation of the left elbow, left knee, and right ankle were performed. 1.  Left elbow: There is mild soft tissue swelling over the medial condyle.  There is no hypoechoic change or tear seen within the common flexor tendon.  No joint effusion. 2.  Left  knee: No effusion.  Normal appearance of the patellar tendon.  Mild spurring seen at the medial lateral joint line.  Posteriorly there was a large Baker's cyst visualized on the medial aspect of the knee. 3.  Right ankle: Mild joint effusion.  Normal appearance of the peroneal tendons without tenosynovitis or tears.  The Achilles tendon is intact without tearing.   PROCEDURES:  Consent obtained and verified. Time-out conducted.  Procedure Performed: Left knee Baker cyst aspiration and steroid injection, US guided: Noted no overlying erythema, induration, or other signs of local infection. Under ultrasound guidance, that the Baker's cyst was identified.  Using Doppler flow, it was ensured there was no overlying vasculature.  The overlying skin was prepped in a sterile fashion.  3 cc of lidocaine were injected for analgesia. Topical analgesic spray: Ethyl chloride. Joint: Left knee Needle: 18-gauge, one 1.5 inch Completed without difficulty. Meds: Depo-Medrol 40 mg   Procedure performed: Ankle intraarticular corticosteroid injection; ultrasound guided Noted no overlying erythema, induration, or other signs of local infection. The  right lateral joint space was identified on ultrasound and marked. The overlying skin was prepped in a sterile fashion. Topical analgesic spray: Ethyl chloride. Joint: Right ankle Needle: 25-gauge, 1.5 inch Completed without difficulty. Meds: Depo-Medrol 40 mg, lidocaine 2 cc  Advised to call if fevers/chills, erythema, induration, drainage, or persistent bleeding.     Assessment:     Unclear etiology for her polyarthropathy at this point. Possibly pseudogout given other testing - including ANA, anti-CCP, CRP, uric acid, and RF - has been negative up until now. Also possibly seronegative arthropathy. 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 She does have an elevated ESR of 53 which is nonspecific.  Less likely reactive arthritis given no known exposure to tick-borne infection and no sexual activity. Less likely autoimmune given negative ANA and anti-CCP. Less likely gout with a normal uric acid and no history, though this is still a possibility. Unlikely infectious given clear synovial fluid on arthrocentesis, no erythema or warmth on exam, and no recent infections.  Performed arthrocentesis of L Baker Cyst today with injection of steroid in knee and ankle to relieve pain. She is traveling to [place] in 2 weeks so would like to postpone Rheumatology referral until she returns. Will see her back in clinic next week before her trip to go over test results. If cell count, gram stain, culture, and crystal studies of her knee joint fluid are negative, will send to rheumatology.     Plan:     - L Knee Baker cyst was aspirated and injected as described below. - Synovial fluid sent for culture, cell count, crystals - R Ankle steroid injection, ultrasound guided - Return to clinic next week to go over test results and formulate plan  Jamila Slatten, MS4     I saw and evaluated the patient with the Medical Student above.  I  agree with the evaluation and treatment plan as discussed. I have edited the above note as necessary. Coralyn Helling, DO Sports Medicine Fellow

## 2018-02-20 ENCOUNTER — Telehealth: Payer: Self-pay | Admitting: Family Medicine

## 2018-02-20 LAB — SYNOVIAL FLUID, CELL COUNT
Eos, Fluid: 0 %
Lining Cells, Synovial: 0 %
Lymphs, Fluid: 63 %
Macrophages Fld: 19 %
Nuc cell # Fld: 129 cells/uL (ref 0–200)
POLYS FL: 18 %
RBC, Fluid: 15000 /uL

## 2018-02-20 NOTE — Telephone Encounter (Signed)
Spoke with patients relative and he understood that to finalize patients application we need an updated notarized letter of support that includes income/Residence. He agreed to pick one up today and had no further questions or concerns

## 2018-02-20 NOTE — Telephone Encounter (Signed)
Attempted to call patient several times in regards to a Notarized letter that is needed to finalize her application for CAFA. unable to reach patient and LVM with Arabic interpreted Mohamed(ID;[id number])to bring back an updated notarized letter and to call us back if they have any questions, the message expressed the urgency to obtain these documents before the deadline 02/27/2018 and to call us back if they had any questions

## 2018-02-21 LAB — BODY FLUID CULTURE

## 2018-02-23 ENCOUNTER — Encounter: Payer: Self-pay | Admitting: Sports Medicine

## 2018-02-23 ENCOUNTER — Ambulatory Visit (INDEPENDENT_AMBULATORY_CARE_PROVIDER_SITE_OTHER): Payer: Self-pay | Admitting: Sports Medicine

## 2018-02-23 VITALS — BP 163/80 | Ht 64.17 in | Wt 154.3 lb

## 2018-02-23 DIAGNOSIS — M25562 Pain in left knee: Secondary | ICD-10-CM

## 2018-02-23 DIAGNOSIS — M25522 Pain in left elbow: Secondary | ICD-10-CM

## 2018-02-23 DIAGNOSIS — M25571 Pain in right ankle and joints of right foot: Secondary | ICD-10-CM

## 2018-02-23 MED ORDER — OMEPRAZOLE 20 MG PO CPDR: 20.0000 mg | DELAYED_RELEASE_CAPSULE | Freq: Every day | ORAL | 0 refills | Status: DC

## 2018-02-23 MED ORDER — DICLOFENAC SODIUM 75 MG PO TBEC: 75.0000 mg | DELAYED_RELEASE_TABLET | Freq: Two times a day (BID) | ORAL | 0 refills | Status: DC | PRN

## 2018-02-23 MED FILL — OMEPRAZOLE 20 MG CAP: 20 | 30 days supply | Qty: 30 | Fill #0

## 2018-02-23 MED FILL — DICLOFENAC SOD EC 75 MG TAB: 75 | 30 days supply | Qty: 60 | Fill #0

## 2018-02-23 NOTE — Progress Notes (Signed)
April Bell - 58 y.o. female MRN [id number]  Date of birth: [birth year]    SUBJECTIVE:      Chief Complaint: Right ankle pain, left knee pain, left elbow pain follow-up  HPI:  58 year old female with polyarthralgias returns today for follow-up.  At her previous visit, she underwent aspiration and injection of a Baker's cyst behind the left knee.  Steroid injection of the right ankle.  Today, she reports improvement of the medial left elbow pain, improvement of the left knee pain with improved range of motion.  She also states that the swelling has improved.  She does state however that her right ankle seems more painful.  She localizes this pain today to the Achilles tendon.  Is sharp pain.  Worse with walking.  She no longer seems to have pain over the anterior aspect of the ankle.  She states that the swelling in the ankle is also improved.  She denies any erythema.  No skin changes at any joints.  No numbness or tingling upper lower extremities.   ROS:     See HPI  PERTINENT  PMH / PSH FH / / SH:  Past Medical, Surgical, Social, and Family History Reviewed & Updated in the EMR.    OBJECTIVE: BP (!) 163/80   Ht 5' 4.17" (1.63 m)   Wt 154 lb 5.2 oz (70 kg)   BMI 26.35 kg/m   Physical Exam:  Vital signs are reviewed.  GEN: Alert and oriented, NAD Pulm: Breathing unlabored PSY: normal mood, congruent affect  MSK: Left elbow: No obvious deformity or swelling.  No erythema Very mild tenderness over the medial epicondyle Full range of motion without pain 5/5 strength in elbow flexion extension.  5/5 strength with wrist flexion extension without pain N/V intact in the upper extremity  Left knee: No rash deformity.  No effusion present.  No erythema Mild medial lateral joint line tenderness which is significant improved from 1 week ago. She no longer has a large palpable Baker's cyst in the popliteal fossa Full range of motion of the knee. 5/5 strength with knee flexion  extension N/V intact slowly  Right ankle: No effusion or erythema No anterior ankle tenderness.  No medial or lateral ankle tenderness.  She does have tenderness posteriorly over the mid substance of the Achilles.  No tenderness at the insertion on the calcaneus Full range of motion of the ankle with 5/5 strength without pain N/V intact  ASSESSMENT & PLAN:  1.  Right ankle pain- patient seems to have new pain in the right ankle.  She no longer is pain related to the ankle joint.  This is more consistent with Achilles tendinitis today.  I believe the steroid injection we performed was helpful for the previous underlying problem. -  heel lift - Home exercises for Achilles tendon  2.  Left knee pain-this is greatly improved.  She no longer has a palpable Baker's cyst.  Review of the joint fluid analysis showed no crystals or infection.  Is also not consistent with an otherwise inflammatory arthritis. - Diclofenac 75 mg twice daily as needed.  Patient [place] - Prilosec 20 mg daily to prevent gastritis - The sudden onset of inflammatory appearing arthritis in multiple joints without trauma remain suspicious.  She has had normal fluid analysis, unremarkable x-rays and otherwise negative rheumatologic work-up with the exception of a slightly elevated ESR at 53.  Recommend she follow-up with her  PCP when she returns from vacation for further medical evaluation.  3.  Medial left elbow pain-resolved  I was the preceptor for this visit and available for immediate consultation Shellia Cleverly, DO

## 2018-02-24 ENCOUNTER — Telehealth: Payer: Self-pay | Admitting: Family Medicine

## 2018-02-24 NOTE — Telephone Encounter (Signed)
LVM to informed Pt that she has been approve for CAFA and we are mailing the letter today

## 2018-02-24 NOTE — Telephone Encounter (Signed)
Patient called to check on the status of his CAFA letter. Please follow up

## 2018-03-01 ENCOUNTER — Other Ambulatory Visit (HOSPITAL_COMMUNITY): Payer: Self-pay | Admitting: *Deleted

## 2018-03-01 DIAGNOSIS — N6310 Unspecified lump in the right breast, unspecified quadrant: Secondary | ICD-10-CM

## 2018-03-20 ENCOUNTER — Ambulatory Visit: Payer: No Typology Code available for payment source | Admitting: Family Medicine

## 2018-06-22 ENCOUNTER — Ambulatory Visit (HOSPITAL_COMMUNITY): Payer: No Typology Code available for payment source

## 2018-06-22 ENCOUNTER — Other Ambulatory Visit: Payer: No Typology Code available for payment source

## 2018-07-03 ENCOUNTER — Ambulatory Visit: Payer: No Typology Code available for payment source | Admitting: Family Medicine

## 2018-09-13 ENCOUNTER — Encounter (HOSPITAL_COMMUNITY): Payer: Self-pay | Admitting: *Deleted

## 2018-09-13 ENCOUNTER — Encounter (HOSPITAL_COMMUNITY): Payer: Self-pay

## 2019-02-19 ENCOUNTER — Ambulatory Visit: Payer: No Typology Code available for payment source | Admitting: Nurse Practitioner

## 2019-02-26 ENCOUNTER — Other Ambulatory Visit: Payer: Self-pay

## 2019-02-26 ENCOUNTER — Ambulatory Visit (INDEPENDENT_AMBULATORY_CARE_PROVIDER_SITE_OTHER): Payer: Medicaid Other | Admitting: Nurse Practitioner

## 2019-02-26 VITALS — BP 155/77 | HR 68 | Temp 98.8°F | Resp 16 | Ht 65.0 in | Wt 168.0 lb

## 2019-02-26 DIAGNOSIS — K219 Gastro-esophageal reflux disease without esophagitis: Secondary | ICD-10-CM

## 2019-02-26 DIAGNOSIS — M2550 Pain in unspecified joint: Secondary | ICD-10-CM

## 2019-02-26 DIAGNOSIS — E782 Mixed hyperlipidemia: Secondary | ICD-10-CM | POA: Diagnosis not present

## 2019-02-26 DIAGNOSIS — R112 Nausea with vomiting, unspecified: Secondary | ICD-10-CM

## 2019-02-26 DIAGNOSIS — R7303 Prediabetes: Secondary | ICD-10-CM | POA: Diagnosis not present

## 2019-02-26 DIAGNOSIS — K5909 Other constipation: Secondary | ICD-10-CM

## 2019-02-26 LAB — POCT GLYCOSYLATED HEMOGLOBIN (HGB A1C): Hemoglobin A1C: 5.7 % — AB (ref 4.0–5.6)

## 2019-02-26 MED ORDER — IBUPROFEN 800 MG PO TABS: 800.0000 mg | ORAL_TABLET | Freq: Three times a day (TID) | ORAL | 0 refills | Status: AC | PRN

## 2019-02-26 MED FILL — IBUPROFEN 800 MG TABLET: 800 | 14 days supply | Qty: 42 | Fill #0

## 2019-02-26 NOTE — Patient Instructions (Addendum)
Arthritis Arthritis means joint pain. It can also mean joint disease. A joint is a place where bones come together. There are more than 100 types of arthritis. What are the causes? This condition may be caused by:  Wear and tear of a joint. This is the most common cause.  A lot of acid in the blood, which leads to pain in the joint (gout).  Pain and swelling (inflammation) in a joint.  Infection of a joint.  Injuries in the joint.  A reaction to medicines (allergy). In some cases, the cause may not be known. What are the signs or symptoms? Symptoms of this condition include:  Redness at a joint.  Swelling at a joint.  Stiffness at a joint.  Warmth coming from the joint.  A fever.  A feeling of being sick. How is this treated? This condition may be treated with:  Treating the cause, if it is known.  Rest.  Raising (elevating) the joint.  Putting cold or hot packs on the joint.  Medicines to treat symptoms and reduce pain and swelling.  Shots of medicines (cortisone) into the joint. You may also be told to make changes in your life, such as doing exercises and losing weight. Follow these instructions at home: Medicines  Take over-the-counter and prescription medicines only as told by your doctor.  Do not take aspirin for pain if your doctor says that you may have gout. Activity  Rest your joint if your doctor tells you to.  Avoid activities that make the pain worse.  Exercise your joint regularly as told by your doctor. Try doing exercises like: ? Swimming. ? Water aerobics. ? Biking. ? Walking. Managing pain, stiffness, and swelling      If told, put ice on the affected area. ? Put ice in a plastic bag. ? Place a towel between your skin and the bag. ? Leave the ice on for 20 minutes, 2-3 times per day.  If your joint is swollen, raise (elevate) it above the level of your heart if told by your doctor.  If your joint feels stiff in the morning,  try taking a warm shower.  If told, put heat on the affected area. Do this as often as told by your doctor. Use the heat source that your doctor recommends, such as a moist heat pack or a heating pad. If you have diabetes, do not apply heat without asking your doctor. To apply heat: ? Place a towel between your skin and the heat source. ? Leave the heat on for 20-30 minutes. ? Remove the heat if your skin turns bright red. This is very important if you are unable to feel pain, heat, or cold. You may have a greater risk of getting burned. General instructions  Do not use any products that contain nicotine or tobacco, such as cigarettes, e-cigarettes, and chewing tobacco. If you need help quitting, ask your doctor.  Keep all follow-up visits as told by your doctor. This is important. Contact a doctor if:  The pain gets worse.  You have a fever. Get help right away if:  You have very bad pain in your joint.  You have swelling in your joint.  Your joint is red.  Many joints become painful and swollen.  You have very bad back pain.  Your leg is very weak.  You cannot control your pee (urine) or poop (stool). Summary  Arthritis means joint pain. It can also mean joint disease. A joint is a place   where bones come together.  The most common cause of this condition is wear and tear of a joint.  Symptoms of this condition include redness, swelling, or stiffness of the joint.  This condition is treated with rest, raising the joint, medicines, and putting cold or hot packs on the joint.  Follow your doctor's instructions about medicines, activity, exercises, and other home care treatments. This information is not intended to replace advice given to you by your health care provider. Make sure you discuss any questions you have with your health care provider. Document Revised: 11/28/2017 Document Reviewed: 11/28/2017 Elsevier Patient Education  [place].  High  Cholesterol  High cholesterol is a condition in which the blood has high levels of a white, waxy, fat-like substance (cholesterol). The human body needs small amounts of cholesterol. The liver makes all the cholesterol that the body needs. Extra (excess) cholesterol comes from the food that we eat. Cholesterol is carried from the liver by the blood through the blood vessels. If you have high cholesterol, deposits (plaques) may build up on the walls of your blood vessels (arteries). Plaques make the arteries narrower and stiffer. Cholesterol plaques increase your risk for heart attack and stroke. Work with your health care provider to keep your cholesterol levels in a healthy range. What increases the risk? This condition is more likely to develop in people who:  Eat foods that are high in animal fat (saturated fat) or cholesterol.  Are overweight.  Are not getting enough exercise.  Have a family history of high cholesterol. What are the signs or symptoms? There are no symptoms of this condition. How is this diagnosed? This condition may be diagnosed from the results of a blood test.  If you are older than age 58, your health care provider may check your cholesterol every 4-6 years.  You may be checked more often if you already have high cholesterol or other risk factors for heart disease. The blood test for cholesterol measures:  "Bad" cholesterol (LDL cholesterol). This is the main type of cholesterol that causes heart disease. The desired level for LDL is less than 100.  "Good" cholesterol (HDL cholesterol). This type helps to protect against heart disease by cleaning the arteries and carrying the LDL away. The desired level for HDL is 60 or higher.  Triglycerides. These are fats that the body can store or burn for energy. The desired number for triglycerides is lower than 150.  Total cholesterol. This is a measure of the total amount of cholesterol in your blood, including LDL  cholesterol, HDL cholesterol, and triglycerides. A healthy number is less than 200. How is this treated? This condition is treated with diet changes, lifestyle changes, and medicines. Diet changes  This may include eating more whole grains, fruits, vegetables, nuts, and fish.  This may also include cutting back on red meat and foods that have a lot of added sugar. Lifestyle changes  Changes may include getting at least 40 minutes of aerobic exercise 3 times a week. Aerobic exercises include walking, biking, and swimming. Aerobic exercise along with a healthy diet can help you maintain a healthy weight.  Changes may also include quitting smoking. Medicines  Medicines are usually given if diet and lifestyle changes have failed to reduce your cholesterol to healthy levels.  Your health care provider may prescribe a statin medicine. Statin medicines have been shown to reduce cholesterol, which can reduce the risk of heart disease. Follow these instructions at home: Eating and drinking  If told by your health care provider:  Eat chicken (without skin), fish, veal, shellfish, ground [place] breast, and round or loin cuts of red meat.  Do not eat fried foods or fatty meats, such as hot dogs and salami.  Eat plenty of fruits, such as apples.  Eat plenty of vegetables, such as broccoli, potatoes, and carrots.  Eat beans, peas, and lentils.  Eat grains such as barley, rice, couscous, and bulgur wheat.  Eat pasta without cream sauces.  Use skim or nonfat milk, and eat low-fat or nonfat yogurt and cheeses.  Do not eat or drink whole milk, cream, ice cream, egg yolks, or hard cheeses.  Do not eat stick margarine or tub margarines that contain trans fats (also called partially hydrogenated oils).  Do not eat saturated tropical oils, such as coconut oil and palm oil.  Do not eat cakes, cookies, crackers, or other baked goods that contain trans fats.  General instructions  Exercise as  directed by your health care provider. Increase your activity level with activities such as gardening, walking, and taking the stairs.  Take over-the-counter and prescription medicines only as told by your health care provider.  Do not use any products that contain nicotine or tobacco, such as cigarettes and e-cigarettes. If you need help quitting, ask your health care provider.  Keep all follow-up visits as told by your health care provider. This is important. Contact a health care provider if:  You are struggling to maintain a healthy diet or weight.  You need help to start on an exercise program.  You need help to stop smoking. Get help right away if:  You have chest pain.  You have trouble breathing. This information is not intended to replace advice given to you by your health care provider. Make sure you discuss any questions you have with your health care provider. Document Revised: 12/24/2016 Document Reviewed: 06/21/2015 Elsevier Patient Education  2020 [employer].  Constipation, Adult Constipation is when a person:  Poops (has a bowel movement) fewer times in a week than normal.  Has a hard time pooping.  Has poop that is dry, hard, or bigger than normal. Follow these instructions at home: Eating and drinking   Eat foods that have a lot of fiber, such as: ? Fresh fruits and vegetables. ? Whole grains. ? Beans.  Eat less of foods that are high in fat, low in fiber, or overly processed, such as: ? [place] fries. ? Hamburgers. ? Cookies. ? Candy. ? Soda.  Drink enough fluid to keep your pee (urine) clear or pale yellow. General instructions  Exercise regularly or as told by your doctor.  Go to the restroom when you feel like you need to poop. Do not hold it in.  Take over-the-counter and prescription medicines only as told by your doctor. These include any fiber supplements.  Do pelvic floor retraining exercises, such as: ? Doing deep breathing while  relaxing your lower belly (abdomen). ? Relaxing your pelvic floor while pooping.  Watch your condition for any changes.  Keep all follow-up visits as told by your doctor. This is important. Contact a doctor if:  You have pain that gets worse.  You have a fever.  You have not pooped for 4 days.  You throw up (vomit).  You are not hungry.  You lose weight.  You are bleeding from the anus.  You have thin, pencil-like poop (stool). Get help right away if:  You have a fever, and your symptoms  suddenly get worse.  You leak poop or have blood in your poop.  Your belly feels hard or bigger than normal (is bloated).  You have very bad belly pain.  You feel dizzy or you faint. This information is not intended to replace advice given to you by your health care provider. Make sure you discuss any questions you have with your health care provider. Document Revised: 12/03/2016 Document Reviewed: 06/11/2015 Elsevier Patient Education  2020 [employer].

## 2019-02-26 NOTE — Progress Notes (Signed)
Established Patient Office Visit  Subjective:  Patient ID: April Bell, female    DOB: 09/26/1960  Age: 59 y.o. MRN: [id number]  CC:  Chief Complaint  Patient presents with  . Hyperlipidemia  . Follow-up    pre diabetes   . Knee Pain    left knee   . Elbow Pain    left knee     HPI April Bell presents for follow-up.  She is a previous patient of Lanae Boast.  She has not been seen 1 year.  She  has a past medical history of Hyperlipidemia.  Today she is complaining of pain in multiple joints.  She admits that she has had left knee pain with swelling in the past.  She denies any recent accidents or injuries.  She does have swelling and stiffness.  She is unable to bend her knee to get down and pray.  She feels like this has been going on for approximately 10 days.  She has not tried any over-the-counter medications.  She did see orthopedics in the past and had aspiration and knee injection which was effective until now.  She is also complaining of left elbow pain.  She admits that it comes and goes.  She is having some swelling.  She denies any accident or injury.  She denies any repetitive movements.  She denies any numbness or tingling.  She is also complaining of right ankle pain.  She has recently noticed swelling and tenderness.  She has limitations but he is able to walk. She denies any accident or injury.  During the review of systems she complained of weight changes.  She has had approximate 10 pound weight loss.  She admits that she does have nausea and vomiting.  This does come with eating certain foods.  Her last episode of vomiting was approximately 2 days ago after eating pizza.  She denies any fever, chills or diarrhea.  She admits that she does have ongoing constipation at least 2-3 times per week.  Past Medical History:  Diagnosis Date  . Hyperlipidemia     No past surgical history on file.  Family History  Problem Relation Age of Onset  . Hypertension  Mother   . Breast cancer Neg Hx     Social History   Socioeconomic History  . Marital status: Married    Spouse name: Not on file  . Number of children: Not on file  . Years of education: Not on file  . Highest education level: Not on file  Occupational History  . Not on file  Tobacco Use  . Smoking status: Former Research scientist (life sciences)  . Smokeless tobacco: Never Used  Substance and Sexual Activity  . Alcohol use: No    Alcohol/week: 0.0 standard drinks  . Drug use: No  . Sexual activity: Yes  Other Topics Concern  . Not on file  Social History Narrative  . Not on file   Social Determinants of Health   Financial Resource Strain:   . Difficulty of Paying Living Expenses: Not on file  Food Insecurity:   . Worried About Charity fundraiser in the Last Year: Not on file  . Ran Out of Food in the Last Year: Not on file  Transportation Needs:   . Lack of Transportation (Medical): Not on file  . Lack of Transportation (Non-Medical): Not on file  Physical Activity:   . Days of Exercise per Week: Not on file  . Minutes of Exercise per Session: Not on  file  Stress:   . Feeling of Stress : Not on file  Social Connections:   . Frequency of Communication with Friends and Family: Not on file  . Frequency of Social Gatherings with Friends and Family: Not on file  . Attends Religious Services: Not on file  . Active Member of Clubs or Organizations: Not on file  . Attends Banker Meetings: Not on file  . Marital Status: Not on file  Intimate Partner Violence:   . Fear of Current or Ex-Partner: Not on file  . Emotionally Abused: Not on file  . Physically Abused: Not on file  . Sexually Abused: Not on file    Outpatient Medications Prior to Visit  Medication Sig Dispense Refill  . ibuprofen (ADVIL) 200 MG tablet Take 200 mg by mouth every 6 (six) hours as needed.    Marland Kitchen aspirin EC 81 MG tablet Take 1 tablet (81 mg total) by mouth daily. (Patient not taking: Reported on 06/02/2017)  30 tablet 11  . cetirizine (ZYRTEC) 10 MG tablet Take 1 tablet (10 mg total) by mouth daily. (Patient not taking: Reported on 01/30/2018) 30 tablet 11  . diclofenac (VOLTAREN) 75 MG EC tablet Take 1 tablet (75 mg total) by mouth 2 (two) times daily as needed. (Patient not taking: Reported on 02/26/2019) 180 tablet 0  . fluticasone (FLONASE) 50 MCG/ACT nasal spray Place 2 sprays into both nostrils daily. (Patient not taking: Reported on 01/30/2018) 16 g 6  . methylPREDNISolone (MEDROL DOSEPAK) 4 MG TBPK tablet Take as directed (Patient not taking: Reported on 02/26/2019) 21 tablet 0  . naproxen (NAPROSYN) 500 MG tablet Take 1 tablet (500 mg total) by mouth 2 (two) times daily with a meal. (Patient not taking: Reported on 02/26/2019) 30 tablet 0  . omeprazole (PRILOSEC) 20 MG capsule Take 1 capsule (20 mg total) by mouth daily. (Patient not taking: Reported on 02/26/2019) 90 capsule 0  . pravastatin (PRAVACHOL) 40 MG tablet TAKE 1 TABLET (40 MG TOTAL) BY MOUTH DAILY. (Patient not taking: Reported on 01/30/2018) 90 tablet 3   No facility-administered medications prior to visit.    No Known Allergies  ROS Review of Systems  Constitutional: Positive for unexpected weight change.  HENT: Negative.   Eyes: Negative.   Respiratory: Negative.   Cardiovascular: Negative.   Gastrointestinal: Positive for constipation and vomiting.  Endocrine: Negative.   Genitourinary: Negative.   Musculoskeletal: Positive for joint swelling.  Skin: Negative.   Neurological: Negative.   Hematological: Negative.   Psychiatric/Behavioral: Negative.       Objective:    Physical Exam  Constitutional: She is oriented to person, place, and time. She appears well-developed and well-nourished.  HENT:  Head: Normocephalic.  Cardiovascular: Normal rate, regular rhythm and normal heart sounds.  Pulmonary/Chest: Effort normal and breath sounds normal.  Abdominal: Soft. There is abdominal tenderness.  Musculoskeletal:        Arms:     Cervical back: Normal range of motion.     Left knee: Swelling present. Tenderness present.     Right ankle: Swelling present. Tenderness present.  Neurological: She is alert and oriented to person, place, and time.  Skin: Skin is warm.  Psychiatric: She has a normal mood and affect. Her behavior is normal. Judgment and thought content normal.    BP (!) 155/77 (BP Location: Right Arm, Patient Position: Sitting, Cuff Size: Normal)   Pulse 68   Temp 98.8 F (37.1 C) (Oral)   Resp 16   Ht  5\' 5"  (1.651 m)   Wt 168 lb (76.2 kg)   SpO2 100%   BMI 27.96 kg/m  Wt Readings from Last 3 Encounters:  02/26/19 168 lb (76.2 kg)  02/23/18 154 lb 5.2 oz (70 kg)  02/17/18 154 lb 5.2 oz (70 kg)     Health Maintenance Due  Topic Date Due  . COLONOSCOPY  03/05/2011  . PAP SMEAR-Modifier  02/12/2019    There are no preventive care reminders to display for this patient.  Lab Results  Component Value Date   TSH 1.070 09/19/2017   Lab Results  Component Value Date   WBC 3.6 09/19/2017   HGB 13.3 09/19/2017   HCT 40.5 09/19/2017   MCV 90 09/19/2017   PLT 231 09/19/2017   Lab Results  Component Value Date   NA 141 09/19/2017   K 4.3 09/19/2017   CO2 22 09/19/2017   GLUCOSE 113 (H) 09/19/2017   BUN 9 09/19/2017   CREATININE 0.70 09/19/2017   BILITOT <0.2 09/19/2017   ALKPHOS 60 09/19/2017   AST 18 09/19/2017   ALT 15 09/19/2017   PROT 7.1 09/19/2017   ALBUMIN 4.3 09/19/2017   CALCIUM 9.7 09/19/2017   Lab Results  Component Value Date   CHOL 257 (H) 01/17/2017   Lab Results  Component Value Date   HDL 85 01/17/2017   Lab Results  Component Value Date   LDLCALC 152 (H) 01/17/2017   Lab Results  Component Value Date   TRIG 101 01/17/2017   Lab Results  Component Value Date   CHOLHDL 3.0 01/17/2017   Lab Results  Component Value Date   HGBA1C 5.7 (A) 02/26/2019      Assessment & Plan:   Problem List Items Addressed This Visit      Unprioritized    GERD (gastroesophageal reflux disease)   Relevant Orders   Comp. Metabolic Panel (12)   Hyperlipidemia   Relevant Orders   Lipid panel   Prediabetes - Primary   Relevant Orders   HgB A1c (Completed)   Comp. Metabolic Panel (12)    Other Visit Diagnoses    Arthralgia, unspecified joint       Orthopedic referral Anti-inflammatory Labs pending Education provided   Relevant Orders   C-reactive protein   Sedimentation rate   Ambulatory referral to Sports Medicine   Uric Acid   Non-intractable vomiting with nausea, unspecified vomiting type       Work-up pending   Relevant Orders   CBC with Differential/Platelet   H. pylori Breath Collection   Other constipation       Education provided      Meds ordered this encounter  Medications  . ibuprofen (ADVIL) 800 MG tablet    Sig: Take 1 tablet (800 mg total) by mouth every 8 (eight) hours as needed for up to 14 days for moderate pain. Take on full stomach.    Dispense:  42 tablet    Refill:  0    Order Specific Question:   Supervising Provider    Answer:   02/28/2019 Quentin Angst    Follow-up: Return in about 3 months (around 05/26/2019).    05/28/2019, NP

## 2019-02-27 LAB — COMP. METABOLIC PANEL (12)
AST: 18 IU/L (ref 0–40)
Albumin/Globulin Ratio: 1.2 (ref 1.2–2.2)
Albumin: 3.9 g/dL (ref 3.8–4.9)
Alkaline Phosphatase: 88 IU/L (ref 39–117)
BUN/Creatinine Ratio: 12 (ref 9–23)
BUN: 8 mg/dL (ref 6–24)
Bilirubin Total: <0.2 mg/dL (ref 0.0–1.2)
Calcium: 9.3 mg/dL (ref 8.7–10.2)
Chloride: 104 mmol/L (ref 96–106)
Creatinine, Ser: 0.65 mg/dL (ref 0.57–1.00)
GFR calc Af Amer: 114 mL/min/{1.73_m2} (ref >59)
GFR calc non Af Amer: 99 mL/min/{1.73_m2} (ref >59)
Globulin, Total: 3.3 g/dL (ref 1.5–4.5)
Glucose: 107 mg/dL — ABNORMAL HIGH (ref 65–99)
Potassium: 4.1 mmol/L (ref 3.5–5.2)
Sodium: 141 mmol/L (ref 134–144)
Total Protein: 7.2 g/dL (ref 6.0–8.5)

## 2019-02-27 LAB — CBC WITH DIFFERENTIAL/PLATELET
Basophils Absolute: 0.1 10*3/uL (ref 0.0–0.2)
Basos: 1 %
EOS (ABSOLUTE): 0.1 10*3/uL (ref 0.0–0.4)
Eos: 3 %
Hematocrit: 43.2 % (ref 34.0–46.6)
Hemoglobin: 14.4 g/dL (ref 11.1–15.9)
Immature Grans (Abs): 0 10*3/uL (ref 0.0–0.1)
Immature Granulocytes: 0 %
Lymphocytes Absolute: 2.4 10*3/uL (ref 0.7–3.1)
Lymphs: 52 %
MCH: 29.2 pg (ref 26.6–33.0)
MCHC: 33.3 g/dL (ref 31.5–35.7)
MCV: 88 fL (ref 79–97)
Monocytes Absolute: 0.4 10*3/uL (ref 0.1–0.9)
Monocytes: 9 %
Neutrophils Absolute: 1.6 10*3/uL (ref 1.4–7.0)
Neutrophils: 35 %
Platelets: 244 10*3/uL (ref 150–450)
RBC: 4.93 x10E6/uL (ref 3.77–5.28)
RDW: 12.9 % (ref 11.7–15.4)
WBC: 4.6 10*3/uL (ref 3.4–10.8)

## 2019-02-27 LAB — LIPID PANEL
Chol/HDL Ratio: 2.7 ratio (ref 0.0–4.4)
Cholesterol, Total: 250 mg/dL — ABNORMAL HIGH (ref 100–199)
HDL: 93 mg/dL (ref >39)
LDL Chol Calc (NIH): 138 mg/dL — ABNORMAL HIGH (ref 0–99)
Triglycerides: 111 mg/dL (ref 0–149)
VLDL Cholesterol Cal: 19 mg/dL (ref 5–40)

## 2019-02-27 LAB — URIC ACID: Uric Acid: 3.1 mg/dL (ref 3.0–7.2)

## 2019-02-27 LAB — SEDIMENTATION RATE: Sed Rate: 43 mm/hr — ABNORMAL HIGH (ref 0–40)

## 2019-02-28 ENCOUNTER — Telehealth: Payer: Self-pay

## 2019-02-28 LAB — SPECIMEN STATUS REPORT

## 2019-02-28 LAB — C-REACTIVE PROTEIN: CRP: 10 mg/L (ref 0–10)

## 2019-02-28 NOTE — Telephone Encounter (Signed)
Called using [employer] [phone]. No answer. A voicemail was left for patient to call back to our office. Thanks !

## 2019-02-28 NOTE — Telephone Encounter (Signed)
-----   Message from Barbette Merino, NP sent at 02/28/2019  9:23 AM EST ----- Please make the patient aware that overall her labs are stable.  Her total cholesterol remains elevated 250 please make sure that she is using the pravastatin as directedHer uric acid level was negative.  She does have some inflammation which is apparent by swelling in her joints.  Orthopedic referral already made

## 2019-03-01 NOTE — Telephone Encounter (Signed)
Called using ID [id number]. No answer. Interpreter left a message for patient to call back. Thanks!

## 2019-03-05 ENCOUNTER — Other Ambulatory Visit: Payer: No Typology Code available for payment source

## 2019-03-05 ENCOUNTER — Telehealth: Payer: Self-pay | Admitting: Nurse Practitioner

## 2019-03-05 NOTE — Telephone Encounter (Signed)
Called using language resources ID [phone]. No answer. Left a message for patient to call back. Thanks!

## 2019-03-06 ENCOUNTER — Telehealth: Payer: Self-pay | Admitting: Nurse Practitioner

## 2019-03-06 ENCOUNTER — Ambulatory Visit (INDEPENDENT_AMBULATORY_CARE_PROVIDER_SITE_OTHER): Payer: No Typology Code available for payment source

## 2019-03-06 ENCOUNTER — Ambulatory Visit: Payer: Self-pay

## 2019-03-06 ENCOUNTER — Encounter: Payer: Self-pay | Admitting: Family Medicine

## 2019-03-06 ENCOUNTER — Ambulatory Visit (INDEPENDENT_AMBULATORY_CARE_PROVIDER_SITE_OTHER): Payer: No Typology Code available for payment source | Admitting: Family Medicine

## 2019-03-06 ENCOUNTER — Other Ambulatory Visit: Payer: Self-pay

## 2019-03-06 VITALS — BP 130/72 | HR 62 | Ht 65.0 in | Wt 164.4 lb

## 2019-03-06 DIAGNOSIS — M25571 Pain in right ankle and joints of right foot: Secondary | ICD-10-CM

## 2019-03-06 DIAGNOSIS — G8929 Other chronic pain: Secondary | ICD-10-CM

## 2019-03-06 DIAGNOSIS — M25562 Pain in left knee: Secondary | ICD-10-CM

## 2019-03-06 DIAGNOSIS — M25561 Pain in right knee: Secondary | ICD-10-CM

## 2019-03-06 DIAGNOSIS — E785 Hyperlipidemia, unspecified: Secondary | ICD-10-CM

## 2019-03-06 DIAGNOSIS — M7702 Medial epicondylitis, left elbow: Secondary | ICD-10-CM

## 2019-03-06 MED ORDER — PRAVASTATIN SODIUM 40 MG PO TABS: 40.0000 mg | ORAL_TABLET | Freq: Every day | ORAL | 3 refills | Status: DC

## 2019-03-06 MED ORDER — DICLOFENAC SODIUM 1 % EX GEL: 4.0000 g | Freq: Four times a day (QID) | CUTANEOUS | 11 refills | Status: DC

## 2019-03-06 MED FILL — PRAVASTATIN NA 40 MG TAB: 40 | 30 days supply | Qty: 30 | Fill #0

## 2019-03-06 NOTE — Progress Notes (Signed)
Subjective:    I'm seeing this patient as a consultation for:  Dionisio David, NP. Note will be routed back to referring provider/PCP.  CC: Polyarthralgia - L knee, L elbow, R ankle  I, Molly Weber, LAT, ATC, am serving as scribe for Dr. Lynne Leader.  HPI: Pt is a 59 y/o female presenting w/ c/o pain in multiple joints including her L knee, L elbow and R ankle.  She was previously seen at [facility] in February 2020 and had a L knee aspiration/injection on 02/17/18 and a R ankle injection.  Since that visit, pt reports return of pain.  She has tried oral diclofenac which helps some oral ibuprofen which helps some and oral Tylenol which helps some.  She is tried some topical Aspercreme which did not help.  L knee: Moderate pain and swelling.  Located predominantly at the superior patellar space and posterior knee.  No injury.  She has difficulty with full flexion at times.  Right knee: Again mild to moderate pain with some swelling.  Left worse than right.  L elbow: Located at medial epicondyle.  Pain with some wrist activities.  No radiating pain with or numbness.  No injury  R ankle: Associated with swelling.  Pain is moderate.  Predominantly pain located at posterior ankle with swelling at anterior lateral ankle.  No injury.  Past medical history, Surgical history, Family history, Social history, Allergies, and medications have been entered into the medical record, reviewed.   Review of Systems: No new headache, visual changes, nausea, vomiting, diarrhea, constipation, dizziness, abdominal pain, skin rash, fevers, chills, night sweats, weight loss, swollen lymph nodes, body aches, joint swelling, muscle aches, chest pain, shortness of breath, mood changes, visual or auditory hallucinations.   Objective:    Vitals:   03/06/19 1023  BP: 130/72  Pulse: 62  SpO2: 96%   General: Well Developed, well nourished, and in no acute distress.   MSK:  Left elbow:  Normal-appearing normal motion. Tender palpation medial epicondyle. Some pain with resisted pronation.  Not much pain minimal pain with resisted wrist flexion. No pain with wrist extension or supination. Pulses cap refill and sensation are intact distally.  Left knee: Moderate effusion no erythema no deformity otherwise normal-appearing. Range of motion 0-100 degrees degrees with crepitation. Tender palpation medial and lateral joint line and posterior knee. Stable ligamentous exam. Negative McMurray's test. Intact strength.  Right knee: No significant effusion no erythema no deformity normal-appearing. Range of motion 0-120 degrees with mild crepitation. Nontender. Stable ligamentous exam. Intact strength. Negative Murray's test.  Right foot and ankle: Slightly swollen anterior lateral ankle no erythema.  Normal-appearing otherwise. Mildly tender palpation ATFL region nontender Achilles tendon.  Normal ankle motion.  Normal foot motion. Normal strength. Stable ligamentous exam.  Pulses cap refill and sensation are intact distally.  Lab and Radiology Results  Diagnostic Limited MSK Ultrasound of: Left knee Quad tendon intact moderate joint effusion present. Patellar tendon normal-appearing. Lateral joint line slightly narrowed no obvious meniscus tear. Medial joint line were moderately narrowed with osteophytes.  Partially extruded meniscus present. Posterior medial knee's tiny Baker cyst present.  Intact tendon and vascular structures. 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  Tiny Baker's cyst.  Moderate effusion  Diagnostic Limited MSK Ultrasound of: Right knee Quad tendon intact normal-appearing.  Small joint effusion present. Patellar tendon normal-appearing. Medial and lateral joint line slightly narrowed no visible meniscus tear present. Posterior medial knee no Baker cyst  present. Impression: Mild DJD with mild  effusion.  Diagnostic Limited MSK Ultrasound of: Right ankle Intact normal-appearing Achilles tendon.   Anterior lateral joint line of ankle shows small to moderate effusion. Intact lateral tendinous structures including peroneal tendons. Impression: Moderate joint effusion no obvious Achilles tendinitis or retrocalcaneal bursitis on exam.  X-ray images obtained today personally and independently reviewed  Left knee: Moderate degenerative changes.  No acute fractures.  No significant deformity.  Right knee: Moderate medial compartment DJD.  No acute fractures no significant deformity.  Right ankle: Soft tissue swelling.  No acute fractures.  Minimal degenerative changes.  Left elbow: No acute fractures.  Minimal degenerative changes.  No severe deformity.  Await formal radiology review   Impression and Recommendations:    Assessment and Plan: 59 y.o. female with multiple joint arthralgias.  Left knee: With moderate effusion and small Baker's cyst.  Patient does have DJD changes to explain her pain today.  Discussed treatment plan and options.  Plan for trial of diclofenac gel along with oral Tylenol.  Patient would like to avoid injection if possible.  Check back in 2 to 4 weeks.  If not improving would proceed with steroid injection.  Right knee pain: Mild to moderate DJD.  Again treat as above with Voltaren gel and oral Tylenol.  If not better injection is reasonable.  Right ankle pain: Patient does have effusion on exam and some mild degenerative changes.  Most of the pain that she has is more posterior which is normal-appearing on ultrasound.  Again trial of Voltaren gel however injection may be helpful here as well.  Left elbow pain: Medial epicondylitis.  Home exercise program Voltaren gel check back in a few weeks.Marland Kitchen  PDMP not reviewed this encounter. Orders Placed This Encounter  Procedures  . [place] LIMITED JOINT SPACE STRUCTURES LOW LEFT(NO LINKED CHARGES)    Order Specific  Question:   Reason for Exam (SYMPTOM  OR DIAGNOSIS REQUIRED)    Answer:   L knee pain    Order Specific Question:   Preferred imaging location?    Answer:   Adult [facility]  . DG Knee 4 Views W/Patella Right    Standing Status:   Future    Standing Expiration Date:   05/05/2020    Order Specific Question:   Reason for Exam (SYMPTOM  OR DIAGNOSIS REQUIRED)    Answer:   right knee pain    Order Specific Question:   Is patient pregnant?    Answer:   No    Order Specific Question:   Preferred imaging location?    Answer:   [facility]    Order Specific Question:   Radiology Contrast Protocol - do NOT remove file path    Answer:   \\[email or web address]\Radiant\DXFluoroContrastProtocols.pdf  . DG Knee 4 Views W/Patella Left    Standing Status:   Future    Standing Expiration Date:   05/05/2020    Order Specific Question:   Reason for Exam (SYMPTOM  OR DIAGNOSIS REQUIRED)    Answer:   eval knee pain    Order Specific Question:   Is patient pregnant?    Answer:   No    Order Specific Question:   Preferred imaging location?    Answer:   [facility]    Order Specific Question:   Radiology Contrast Protocol - do NOT remove file path    Answer:   \\[email or web address]\Radiant\DXFluoroContrastProtocols.pdf  . DG Ankle Complete Right    Standing Status:  Future    Standing Expiration Date:   05/05/2020    Order Specific Question:   Reason for Exam (SYMPTOM  OR DIAGNOSIS REQUIRED)    Answer:   right ankle pain    Order Specific Question:   Is patient pregnant?    Answer:   No    Order Specific Question:   Preferred imaging location?    Answer:   [facility]    Order Specific Question:   Radiology Contrast Protocol - do NOT remove file path    Answer:   \\[email or web address]\Radiant\DXFluoroContrastProtocols.pdf  . DG ELBOW COMPLETE LEFT (3+VIEW)    Standing Status:   Future    Standing Expiration Date:   05/05/2020    Order Specific Question:   Reason  for Exam (SYMPTOM  OR DIAGNOSIS REQUIRED)    Answer:   left elbow pain    Order Specific Question:   Is patient pregnant?    Answer:   No    Order Specific Question:   Preferred imaging location?    Answer:   [facility]    Order Specific Question:   Radiology Contrast Protocol - do NOT remove file path    Answer:   \\[email or web address]\[email or web address]s.pdf   Meds ordered this encounter  Medications  . diclofenac Sodium (VOLTAREN) 1 % GEL    Sig: Apply 4 g topically 4 (four) times daily. To affected joint.    Dispense:  100 g    Refill:  11    Discussed warning signs or symptoms. Please see discharge instructions. Patient expresses understanding.   The above documentation has been reviewed and is accurate and complete Clementeen Graham   Visit using Arabic interpreter

## 2019-03-06 NOTE — Telephone Encounter (Signed)
Called and spoke with patient's daughter on [place]. Advised that labs looked stable except for cholesterol being elevated. She states that pt has been out of medication. I have sent in refill for this. Advised that uric acid level was normal so she does not have gout but does have inflammation and that we are sending referral to orthopedic. Daughter advised that appointment has already been scheduled for this. Asked that she keep next appointment. Thanks!

## 2019-03-06 NOTE — Telephone Encounter (Signed)
Pt's daughter returned missed call from nurse. Daughter left a number: [phone]. Please call pt or daughter back. Daughter listed as pt contact.

## 2019-03-06 NOTE — Patient Instructions (Signed)
Thank you for coming in today. Try using voltaren gel.  Get xray today.  Recheck with me in 2-4 weeks.  If not better enough likely next step is injection.   Recommend tylenol 1000mg  every 6 hours as needed pain. Total max dose is 4000mg  per day.  They also make tylenol arthritis which lats longer. 2 pills every 8 hours is max dose.

## 2019-03-07 NOTE — Progress Notes (Signed)
X-ray ankle largely normal-appearing.  No severe arthritis visible. Of note patient will need an [place] Arabic interpreter or results should be called to her daughter.

## 2019-03-07 NOTE — Progress Notes (Signed)
X-ray right knee shows mild arthritis. Of note patient will need an [place] Arabic interpreter or results should be called to her daughter.

## 2019-03-07 NOTE — Progress Notes (Signed)
X-ray left elbow normal-appearing per radiology.  Of note patient will need an [place] Arabic interpreter or results should be called to her daughter.

## 2019-03-07 NOTE — Progress Notes (Signed)
X-ray left knee shows mild arthritis worse than right. Of note patient will need an [place] Arabic interpreter or results should be called to her daughter.

## 2019-03-27 ENCOUNTER — Encounter: Payer: Self-pay | Admitting: Family Medicine

## 2019-03-27 ENCOUNTER — Other Ambulatory Visit: Payer: Self-pay

## 2019-03-27 ENCOUNTER — Ambulatory Visit (INDEPENDENT_AMBULATORY_CARE_PROVIDER_SITE_OTHER): Payer: No Typology Code available for payment source | Admitting: Family Medicine

## 2019-03-27 VITALS — BP 110/78 | HR 68 | Ht 65.0 in | Wt 162.8 lb

## 2019-03-27 DIAGNOSIS — M62838 Other muscle spasm: Secondary | ICD-10-CM

## 2019-03-27 DIAGNOSIS — G8929 Other chronic pain: Secondary | ICD-10-CM

## 2019-03-27 DIAGNOSIS — M25561 Pain in right knee: Secondary | ICD-10-CM

## 2019-03-27 DIAGNOSIS — M25562 Pain in left knee: Secondary | ICD-10-CM

## 2019-03-27 NOTE — Patient Instructions (Signed)
Thank you for coming in today. Continue tylenol and voltaren gel.  Plan for physical therapy.  Recheck with me as needed.

## 2019-03-27 NOTE — Progress Notes (Signed)
I, Christoper Fabian, LAT, ATC, am serving as scribe for Dr. Clementeen Graham.  April Bell is a 59 y.o. female who presents to [facility] today for f/u of multiple c/o including B knee pain, L elbow pain and R ankle pain.  She was last seen by Dr. Denyse Amass on 03/06/19 and was advised to begin using Voltaren gel and to take Tylenol to help control her pain.  She was previously seen at [facility] in Feb 2020 and had a R ankle injection and L knee aspiration/injection.  Since her last visit, pt reports that her B knees are bothering her the most with the L more than the R.  She has been using the Voltaren gel which has provided only limited relief.    Diagnostic testing: B knee XR, L elbow XR, R ankle XR- 03/06/19   Pertinent review of systems: No fevers or chills  Relevant historical information: Prediabetes hyperlipidemia   Exam:  BP 110/78 (BP Location: Right Arm, Patient Position: Sitting, Cuff Size: Normal)   Pulse 68   Ht 5\' 5"  (1.651 m)   Wt 162 lb 12.8 oz (73.8 kg)   SpO2 98%   BMI 27.09 kg/m  General: Well Developed, well nourished, and in no acute distress.   MSK: Knees bilaterally normal motion.  Normal gait. C-spine normal-appearing normal cervical motion.  Upper trunk strength is intact.    Lab and Radiology Results DG ELBOW COMPLETE LEFT (3+VIEW)  Result Date: 03/06/2019 CLINICAL DATA:  Chronic left elbow pain. EXAM: LEFT ELBOW - COMPLETE 3+ VIEW COMPARISON:  None. FINDINGS: There is no evidence of fracture, dislocation, or joint effusion. There is no evidence of arthropathy or other focal bone abnormality. Soft tissues are unremarkable. IMPRESSION: Negative. Electronically Signed   By: 05/06/2019 M.D.   On: 03/06/2019 16:01   DG Ankle Complete Right  Result Date: 03/06/2019 CLINICAL DATA:  Chronic right ankle pain. EXAM: RIGHT ANKLE - COMPLETE 3+ VIEW COMPARISON:  Right ankle x-rays dated February 06, 2018. FINDINGS: No acute  fracture or dislocation. The ankle mortise is symmetric. The talar dome is intact. No tibiotalar joint effusion. Joint spaces are preserved. Small plantar enthesophyte. Soft tissues are unremarkable. IMPRESSION: No acute osseous abnormality or significant degenerative changes. Electronically Signed   By: February 08, 2018 M.D.   On: 03/06/2019 16:00   DG Knee 4 Views W/Patella Left  Result Date: 03/06/2019 CLINICAL DATA:  Chronic knee pain. EXAM: LEFT KNEE - COMPLETE 4+ VIEW COMPARISON:  Radiographs 02/06/2018. FINDINGS: The mineralization and alignment are normal. There is no evidence of acute fracture or dislocation. Mild tricompartmental degenerative changes, greatest within the patellofemoral compartment. No significant joint effusion. The previously questioned small foreign body laterally is no longer visualized. IMPRESSION: Mild tricompartmental degenerative changes. No acute osseous findings. Electronically Signed   By: 04/07/2018 M.D.   On: 03/06/2019 15:59   DG Knee 4 Views W/Patella Right  Result Date: 03/06/2019 CLINICAL DATA:  Chronic right knee pain. EXAM: RIGHT KNEE - COMPLETE 4+ VIEW COMPARISON:  None. FINDINGS: No acute fracture or dislocation. No joint effusion. Joint spaces are preserved. Tiny tricompartmental marginal osteophytes. Soft tissues are unremarkable. IMPRESSION: 1. Very mild tricompartmental degenerative changes. Electronically Signed   By: 05/06/2019 M.D.   On: 03/06/2019 15:59   05/06/2019 LIMITED JOINT SPACE STRUCTURES LOW LEFT(NO LINKED CHARGES)  Result Date: 03/06/2019 Diagnostic Limited MSK Ultrasound of: Left knee Quad tendon intact moderate joint effusion present. Patellar tendon normal-appearing.  Lateral joint line slightly narrowed no obvious meniscus tear. Medial joint line were moderately narrowed with osteophytes.  Partially extruded meniscus present. Posterior medial knee's tiny Baker cyst present.  Intact tendon and vascular structures. Impression: DJD more  dominantly at medial compartment with probable meniscus tear.  Tiny Baker's cyst.  Moderate effusion  Diagnostic Limited MSK Ultrasound of: Right knee Quad tendon intact normal-appearing.  Small joint effusion present. Patellar tendon normal-appearing. Medial and lateral joint line slightly narrowed no visible meniscus tear present. Posterior medial knee no Baker cyst present. Impression: Mild DJD with mild effusion.  Diagnostic Limited MSK Ultrasound of: Right ankle Intact normal-appearing Achilles tendon.  Anterior lateral joint line of ankle shows small to moderate effusion. Intact lateral tendinous structures including peroneal tendons. Impression: Moderate joint effusion no obvious Achilles tendinitis or retrocalcaneal bursitis on exam.   I, Lynne Leader, personally (independently) visualized and performed the interpretation of the images attached in this note.     Assessment and Plan: 59 y.o. female with neck and bilateral knee pain.  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  Discussed options.  Patient is close to maximizing her maximal conservative management.  Recommend adding quad strengthening exercises.  Continue Voltaren gel as needed.  Next step is probably steroid injection.  Patient would like to hold off on that for now and will think about it.  As for her neck pain she should benefit with physical therapy for this.  Plan to refer to physical therapy and recheck back with me as needed.  This visit conducted using a live Arabic interpreter   PDMP not reviewed this encounter. Orders Placed This Encounter  Procedures  . Ambulatory referral to Physical Therapy    Referral Priority:   Routine    Referral Type:   Physical Medicine    Referral Reason:   Specialty Services Required    Requested Specialty:   Physical Therapy   No orders of the defined types were placed in this encounter.    Discussed warning signs or symptoms. Please see discharge instructions. Patient expresses  understanding.   The above documentation has been reviewed and is accurate and complete Lynne Leader    Total encounter time 30 minutes including charting time date of service. Discussed treatment plan and next steps and options.

## 2019-04-19 ENCOUNTER — Ambulatory Visit: Payer: No Typology Code available for payment source | Attending: Internal Medicine

## 2019-04-19 DIAGNOSIS — Z23 Encounter for immunization: Secondary | ICD-10-CM

## 2019-04-19 NOTE — Progress Notes (Signed)
   Covid-19 Vaccination Clinic  Name:  April Bell    MRN: [id number] DOB: 02/23/1960  04/19/2019  [name] was observed post Covid-19 immunization for 15 minutes without incident. She was provided with Vaccine Information Sheet and instruction to access the V-Safe system.   [name] was instructed to call 911 with any severe reactions post vaccine: Marland Kitchen Difficulty breathing  . Swelling of face and throat  . A fast heartbeat  . A bad rash all over body  . Dizziness and weakness   Immunizations Administered    Name Date Dose VIS Date Route   Pfizer COVID-19 Vaccine 04/19/2019 11:10 AM 0.3 mL 12/15/2018 Intramuscular   Manufacturer: [employer], [employer]   Lot: [id number]   NDC: [address]-8

## 2019-05-14 ENCOUNTER — Ambulatory Visit: Payer: No Typology Code available for payment source | Attending: Internal Medicine

## 2019-05-14 DIAGNOSIS — Z23 Encounter for immunization: Secondary | ICD-10-CM

## 2019-05-14 NOTE — Progress Notes (Signed)
   Covid-19 Vaccination Clinic  Name:  April Bell    MRN: [id number] DOB: 21-Aug-1960  05/14/2019  [name] was observed post Covid-19 immunization for 15 minutes without incident. She was provided with Vaccine Information Sheet and instruction to access the V-Safe system.   [name] was instructed to call 911 with any severe reactions post vaccine: Marland Kitchen Difficulty breathing  . Swelling of face and throat  . A fast heartbeat  . A bad rash all over body  . Dizziness and weakness   Immunizations Administered    Name Date Dose VIS Date Route   Pfizer COVID-19 Vaccine 05/14/2019  9:23 AM 0.3 mL 02/28/2018 Intramuscular   Manufacturer: [employer], [employer]   Lot: [id number]   NDC: [address]-5

## 2019-05-25 ENCOUNTER — Ambulatory Visit: Payer: No Typology Code available for payment source | Admitting: Nurse Practitioner

## 2019-06-01 ENCOUNTER — Ambulatory Visit: Payer: No Typology Code available for payment source | Admitting: Nurse Practitioner

## 2019-06-22 MED FILL — PRAVASTATIN NA 40 MG TAB: 40 | 30 days supply | Qty: 30 | Fill #1

## 2019-06-29 ENCOUNTER — Other Ambulatory Visit: Payer: Self-pay

## 2019-06-29 ENCOUNTER — Ambulatory Visit: Payer: Self-pay

## 2019-07-25 ENCOUNTER — Ambulatory Visit (INDEPENDENT_AMBULATORY_CARE_PROVIDER_SITE_OTHER): Payer: No Typology Code available for payment source | Admitting: Nurse Practitioner

## 2019-07-25 ENCOUNTER — Ambulatory Visit (HOSPITAL_COMMUNITY)
Admission: RE | Admit: 2019-07-25 | Discharge: 2019-07-25 | Disposition: A | Discharge status: Home / Self Care | Payer: No Typology Code available for payment source | Source: Ambulatory Visit | Attending: Nurse Practitioner | Admitting: Nurse Practitioner

## 2019-07-25 ENCOUNTER — Other Ambulatory Visit: Payer: Self-pay

## 2019-07-25 ENCOUNTER — Encounter: Payer: Self-pay | Admitting: Nurse Practitioner

## 2019-07-25 VITALS — BP 126/64 | HR 68 | Temp 98.6°F | Resp 14 | Ht 65.0 in | Wt 161.0 lb

## 2019-07-25 DIAGNOSIS — M25511 Pain in right shoulder: Secondary | ICD-10-CM | POA: Insufficient documentation

## 2019-07-25 DIAGNOSIS — R7303 Prediabetes: Secondary | ICD-10-CM

## 2019-07-25 DIAGNOSIS — E782 Mixed hyperlipidemia: Secondary | ICD-10-CM

## 2019-07-25 DIAGNOSIS — K5909 Other constipation: Secondary | ICD-10-CM

## 2019-07-25 NOTE — Patient Instructions (Addendum)
[facility] [address] [place], [place] [address] Phone: [phone] For images   Shoulder Pain Many things can cause shoulder pain, including:  An injury.  Moving the shoulder in the same way again and again (overuse).  Joint pain (arthritis). Pain can come from:  Swelling and irritation (inflammation) of any part of the shoulder.  An injury to the shoulder joint.  An injury to: ? Tissues that connect muscle to bone (tendons). ? Tissues that connect bones to each other (ligaments). ? Bones. Follow these instructions at home: Watch for changes in your symptoms. Let your doctor know about them. Follow these instructions to help with your pain. If you have a sling:  Wear the sling as told by your doctor. Remove it only as told by your doctor.  Loosen the sling if your fingers: ? Tingle. ? Become numb. ? Turn cold and blue.  Keep the sling clean.  If the sling is not waterproof: ? Do not let it get wet. ? Take the sling off when you shower or bathe. Managing pain, stiffness, and swelling   If told, put ice on the painful area: ? Put ice in a plastic bag. ? Place a towel between your skin and the bag. ? Leave the ice on for 20 minutes, 2-3 times a day. Stop putting ice on if it does not help with the pain.  Squeeze a soft ball or a foam pad as much as possible. This prevents swelling in the shoulder. It also helps to strengthen the arm. General instructions  Take over-the-counter and prescription medicines only as told by your doctor.  Keep all follow-up visits as told by your doctor. This is important. Contact a doctor if:  Your pain gets worse.  Medicine does not help your pain.  You have new pain in your arm, hand, or fingers. Get help right away if:  Your arm, hand, or fingers: ? Tingle. ? Are numb. ? Are swollen. ? Are painful. ? Turn white or blue. Summary  Shoulder pain can be caused by many things. These include  injury, moving the shoulder in the same away again and again, and joint pain.  Watch for changes in your symptoms. Let your doctor know about them.  This condition may be treated with a sling, ice, and pain medicine.  Contact your doctor if the pain gets worse or you have new pain. Get help right away if your arm, hand, or fingers tingle or get numb, swollen, or painful.  Keep all follow-up visits as told by your doctor. This is important. This information is not intended to replace advice given to you by your health care provider. Make sure you discuss any questions you have with your health care provider. Document Revised: 07/05/2017 Document Reviewed: 07/05/2017 Elsevier Patient Education  2020 [employer].  Constipation, Adult Constipation is when a person:  Poops (has a bowel movement) fewer times in a week than normal.  Has a hard time pooping.  Has poop that is dry, hard, or bigger than normal. Follow these instructions at home: Eating and drinking   Eat foods that have a lot of fiber, such as: ? Fresh fruits and vegetables. ? Whole grains. ? Beans.  Eat less of foods that are high in fat, low in fiber, or overly processed, such as: ? [place] fries. ? Hamburgers. ? Cookies. ? Candy. ? Soda.  Drink enough fluid to keep your pee (urine) clear or pale yellow. General instructions  Exercise regularly  or as told by your doctor.  Go to the restroom when you feel like you need to poop. Do not hold it in.  Take over-the-counter and prescription medicines only as told by your doctor. These include any fiber supplements.  Do pelvic floor retraining exercises, such as: ? Doing deep breathing while relaxing your lower belly (abdomen). ? Relaxing your pelvic floor while pooping.  Watch your condition for any changes.  Keep all follow-up visits as told by your doctor. This is important. Contact a doctor if:  You have pain that gets worse.  You have a fever.  You  have not pooped for 4 days.  You throw up (vomit).  You are not hungry.  You lose weight.  You are bleeding from the anus.  You have thin, pencil-like poop (stool). Get help right away if:  You have a fever, and your symptoms suddenly get worse.  You leak poop or have blood in your poop.  Your belly feels hard or bigger than normal (is bloated).  You have very bad belly pain.  You feel dizzy or you faint. This information is not intended to replace advice given to you by your health care provider. Make sure you discuss any questions you have with your health care provider. Document Revised: 12/03/2016 Document Reviewed: 06/11/2015 Elsevier Patient Education  2020 [employer].

## 2019-07-25 NOTE — Progress Notes (Signed)
[facility] Patient [facility] [address] [place], [place]  [address] Phone:  [phone]   Fax:  [phone]   Established Patient Office Visit  Subjective:  Patient ID: April Bell, female    DOB: 06-04-1960  Age: 59 y.o. MRN: [id number]  CC:  Chief Complaint  Patient presents with   Follow-up    3 month follow up - Prediabetes    Hyperlipidemia   Shoulder Pain    right shoulder pain x 2 weeks     HPI April Bell presents for 3 month follow up. She  has a past medical history of Hyperlipidemia.   Shoulder Pain Patient complains of right shoulder pain. The symptoms began 2 weeks. Aggravating factors: no known event. Pain is located between the neck and shoulder and around the acromioclavicular [facility]) joint. Discomfort is described as aching, sharp/stabbing and radiating to neck down into her breast area. Symptoms are exacerbated by lying on the shoulder. Evaluation to date: none. Therapy to date includes: rest, avoidance of offending activity and OTC analgesics which are not very effective. Topical cream Voltaren which has been effective at night to help her rest. She feels like the shoulder pain is worse with breathing  Dyslipidemia Patient presents for evaluation of lipids. Compliance with treatment thus far has been poor. A repeat fasting lipid profile was done. The patient does use medications that may worsen dyslipidemias (corticosteroids, progestins, anabolic steroids, diuretics, beta-blockers, amiodarone, cyclosporine, olanzapine). The patient exercises rarely.  The patient is not known to have coexisting coronary artery disease.   Cardiac Risk Factors Age > 45-female, > 55-female:  YES  +1  Smoking:   NO  Sig. family hx of CHD*:  NO  Hypertension:   NO  Diabetes:   NO  HDL < 35:   NO  HDL > 59:   YES  -1  Total: 0  *- Significant family history of Coronary Heart Disease per National Cholesterol Education Program = Myocardial Infarction or sudden death  at less than 67 years old in  father or other 1st-degree female relative, or less than 67 years old in mother or  other 1st-degree female relative.  Constipation Patient complains of constipation. Onset was several weeks ago. Patient has been having 2-3 formed stools per week. Defecation has been difficult. Co-Morbid conditions:unknown. Symptoms have stabilized. Current Health Habits: Eating fiber? yes - , Exercise? no, Adequate hydration? yes - . Current over the counter/prescription laxative: none   Past Medical History:  Diagnosis Date   Hyperlipidemia     History reviewed. No pertinent surgical history.  Family History  Problem Relation Age of Onset   Hypertension Mother    Breast cancer Neg Hx     Social History   Socioeconomic History   Marital status: Married    Spouse name: Not on file   Number of children: Not on file   Years of education: Not on file   Highest education level: Not on file  Occupational History   Not on file  Tobacco Use   Smoking status: Former Smoker   Smokeless tobacco: Never Used  Building services engineer Use: Never used  Substance and Sexual Activity   Alcohol use: No    Alcohol/week: 0.0 standard drinks   Drug use: No   Sexual activity: Yes  Other Topics Concern   Not on file  Social History Narrative   Not on file   Social Determinants of Health   Financial Resource Strain:  Difficulty of Paying Living Expenses:   Food Insecurity:    Worried About Programme researcher, broadcasting/film/video in the Last Year:    Barista in the Last Year:   Transportation Needs:    Freight forwarder (Medical):    Lack of Transportation (Non-Medical):   Physical Activity:    Days of Exercise per Week:    Minutes of Exercise per Session:   Stress:    Feeling of Stress :   Social Connections:    Frequency of Communication with Friends and Family:    Frequency of Social Gatherings with Friends and Family:    Attends Religious  Services:    Active Member of Clubs or Organizations:    Attends Engineer, structural:    Marital Status:   Intimate Partner Violence:    Fear of Current or Ex-Partner:    Emotionally Abused:    Physically Abused:    Sexually Abused:     Outpatient Medications Prior to Visit  Medication Sig Dispense Refill   diclofenac Sodium (VOLTAREN) 1 % GEL Apply 4 g topically 4 (four) times daily. To affected joint. 100 g 11   ibuprofen (ADVIL) 200 MG tablet Take 200 mg by mouth every 6 (six) hours as needed.     omeprazole (PRILOSEC) 20 MG capsule Take 1 capsule (20 mg total) by mouth daily. (Patient not taking: Reported on 02/26/2019) 90 capsule 0   pravastatin (PRAVACHOL) 40 MG tablet Take 1 tablet (40 mg total) by mouth daily. (Patient not taking: Reported on 07/25/2019) 90 tablet 3   No facility-administered medications prior to visit.    No Known Allergies  ROS Review of Systems  Constitutional: Negative.   HENT: Negative.   Eyes: Negative.   Respiratory: Negative.   Cardiovascular: Negative.   Gastrointestinal: Positive for constipation.  Endocrine: Negative.   Neurological: Negative.       Objective:    Physical Exam Constitutional:      General: She is in acute distress.     Appearance: Normal appearance. She is normal weight. She is not ill-appearing or toxic-appearing.  HENT:     Head: Normocephalic and atraumatic.     Nose: Nose normal.     Mouth/Throat:     Mouth: Mucous membranes are moist.  Cardiovascular:     Rate and Rhythm: Normal rate and regular rhythm.     Pulses: Normal pulses.     Heart sounds: Normal heart sounds.  Pulmonary:     Effort: Pulmonary effort is normal.     Breath sounds: Normal breath sounds.  Abdominal:     General: Bowel sounds are normal.     Palpations: Abdomen is soft.  Musculoskeletal:     Right shoulder: Tenderness and crepitus present. Decreased range of motion.     Left shoulder: Normal.     Cervical back:  Normal range of motion.  Skin:    General: Skin is warm.     Capillary Refill: Capillary refill takes less than 2 seconds.     Comments: Small erythematous macules to upper mid back and  1 papule to right side of neck and one papule to right upper torso no vesicles noted  Neurological:     General: No focal deficit present.     Mental Status: She is alert and oriented to person, place, and time.  Psychiatric:        Mood and Affect: Mood normal.        Behavior: Behavior normal.  Thought Content: Thought content normal.        Judgment: Judgment normal.     BP 126/64 (BP Location: Left Arm, Patient Position: Sitting, Cuff Size: Normal)    Pulse 68    Temp 98.6 F (37 C) (Oral)    Resp 14    Ht 5\' 5"  (1.651 m)    Wt 161 lb (73 kg)    SpO2 100%    BMI 26.79 kg/m  Wt Readings from Last 3 Encounters:  07/25/19 161 lb (73 kg)  03/27/19 162 lb 12.8 oz (73.8 kg)  03/06/19 164 lb 6.4 oz (74.6 kg)     Health Maintenance Due  Topic Date Due   COLONOSCOPY  Never done   PAP SMEAR-Modifier  02/12/2019   MAMMOGRAM  06/03/2019    There are no preventive care reminders to display for this patient.  Lab Results  Component Value Date   TSH 1.070 09/19/2017   Lab Results  Component Value Date   WBC 4.6 02/26/2019   HGB 14.4 02/26/2019   HCT 43.2 02/26/2019   MCV 88 02/26/2019   PLT 244 02/26/2019   Lab Results  Component Value Date   NA 141 02/26/2019   K 4.1 02/26/2019   CO2 22 09/19/2017   GLUCOSE 107 (H) 02/26/2019   BUN 8 02/26/2019   CREATININE 0.65 02/26/2019   BILITOT <0.2 02/26/2019   ALKPHOS 88 02/26/2019   AST 18 02/26/2019   ALT 15 09/19/2017   PROT 7.2 02/26/2019   ALBUMIN 3.9 02/26/2019   CALCIUM 9.3 02/26/2019   Lab Results  Component Value Date   CHOL 250 (H) 02/26/2019   Lab Results  Component Value Date   HDL 93 02/26/2019   Lab Results  Component Value Date   LDLCALC 138 (H) 02/26/2019   Lab Results  Component Value Date   TRIG 111  02/26/2019   Lab Results  Component Value Date   CHOLHDL 2.7 02/26/2019   Lab Results  Component Value Date   HGBA1C 5.7 (A) 02/26/2019      Assessment & Plan:   Problem List Items Addressed This Visit      Other   Hyperlipidemia   Relevant Orders   Lipid panel Patient to continue with lifestyle modification of monitoring diet.  She is not interested in taking any medication    Prediabetes   Relevant Orders   Comp. Metabolic Panel (12) Will monitor blood glucose encourage lifestyle modification of diet exercise and weight loss    Other Visit Diagnoses    Acute pain of right shoulder    -  Primary   Relevant Orders   Arthritis Panel   DG Shoulder Right Evaluation pending May consider physical therapy.  Encourage patient to continue with anti-inflammatory agents both oral and topical.  Steroid Dosepak would be an option but because patient does not like to take medication we will hold off   Other constipation Encourage patient to increase intake of water, regular exercise. Encourage patient to try stool softener in the future if needed      No orders of the defined types were placed in this encounter.   Follow-up: Return in about 6 months (around 01/25/2020).    01/27/2020, NP

## 2019-07-26 LAB — LIPID PANEL
Chol/HDL Ratio: 2.7 ratio (ref 0.0–4.4)
Cholesterol, Total: 237 mg/dL — ABNORMAL HIGH (ref 100–199)
HDL: 87 mg/dL (ref >39)
LDL Chol Calc (NIH): 134 mg/dL — ABNORMAL HIGH (ref 0–99)
Triglycerides: 96 mg/dL (ref 0–149)
VLDL Cholesterol Cal: 16 mg/dL (ref 5–40)

## 2019-07-26 LAB — COMP. METABOLIC PANEL (12)
AST: 16 IU/L (ref 0–40)
Albumin/Globulin Ratio: 1.5 (ref 1.2–2.2)
Albumin: 4.1 g/dL (ref 3.8–4.9)
Alkaline Phosphatase: 72 IU/L (ref 48–121)
BUN/Creatinine Ratio: 21 (ref 9–23)
BUN: 14 mg/dL (ref 6–24)
Bilirubin Total: 0.2 mg/dL (ref 0.0–1.2)
Calcium: 9.3 mg/dL (ref 8.7–10.2)
Chloride: 104 mmol/L (ref 96–106)
Creatinine, Ser: 0.67 mg/dL (ref 0.57–1.00)
GFR calc Af Amer: 112 mL/min/{1.73_m2} (ref >59)
GFR calc non Af Amer: 97 mL/min/{1.73_m2} (ref >59)
Globulin, Total: 2.8 g/dL (ref 1.5–4.5)
Glucose: 107 mg/dL — ABNORMAL HIGH (ref 65–99)
Potassium: 4.2 mmol/L (ref 3.5–5.2)
Sodium: 140 mmol/L (ref 134–144)
Total Protein: 6.9 g/dL (ref 6.0–8.5)

## 2019-07-26 LAB — ARTHRITIS PANEL
Anti Nuclear Antibody (ANA): NEGATIVE
Rheumatoid fact SerPl-aCnc: <10 IU/mL (ref 0.0–13.9)
Sed Rate: 38 mm/hr (ref 0–40)
Uric Acid: 3.6 mg/dL (ref 3.0–7.2)

## 2019-07-27 ENCOUNTER — Telehealth: Payer: Self-pay

## 2019-07-27 NOTE — Telephone Encounter (Signed)
-----   Message from Barbette Merino, NP sent at 07/27/2019 10:42 AM EDT ----- Mild arthritisPatient can try a steroid Dosepak or physical therapy.  We can do both if she would like.  Thanks

## 2019-07-27 NOTE — Telephone Encounter (Signed)
Called using language resources [phone] no answer. Message was left for pt to call back. Thanks

## 2019-08-07 ENCOUNTER — Telehealth: Payer: Self-pay

## 2019-08-07 NOTE — Telephone Encounter (Signed)
Crystal, Can you send in steroid pack and place order for referral for physical therapy for pt.

## 2019-08-08 ENCOUNTER — Other Ambulatory Visit: Payer: Self-pay | Admitting: Nurse Practitioner

## 2019-08-08 DIAGNOSIS — M25511 Pain in right shoulder: Secondary | ICD-10-CM

## 2019-08-08 MED ORDER — PREDNISONE 20 MG PO TABS: ORAL_TABLET | ORAL | 0 refills | Status: AC

## 2019-08-08 NOTE — Telephone Encounter (Signed)
Completed.

## 2019-12-10 ENCOUNTER — Other Ambulatory Visit: Payer: Self-pay

## 2019-12-10 ENCOUNTER — Ambulatory Visit (INDEPENDENT_AMBULATORY_CARE_PROVIDER_SITE_OTHER): Payer: No Typology Code available for payment source | Admitting: Nurse Practitioner

## 2019-12-10 ENCOUNTER — Encounter: Payer: Self-pay | Admitting: Nurse Practitioner

## 2019-12-10 VITALS — BP 149/71 | HR 80 | Temp 98.0°F | Resp 18 | Ht 65.75 in | Wt 166.2 lb

## 2019-12-10 DIAGNOSIS — L84 Corns and callosities: Secondary | ICD-10-CM

## 2019-12-10 NOTE — Progress Notes (Signed)
Painful blisters to feet for " a while", worse on L foot

## 2019-12-10 NOTE — Patient Instructions (Signed)

## 2019-12-10 NOTE — Progress Notes (Signed)
° °  [place] Patient [facility] [address] [place], [place]  [address] Phone:  [phone]   Fax:  [phone]  Subjective:    April Bell is a 59 y.o. female who presents with bilateral foot pain. Onset of the symptoms was several months ago. Precipitating event: none known. Current symptoms include: ability to bear weight, but with some pain. Aggravating factors: any weight bearing. Symptoms have gradually worsened. Patient has had no prior foot problems. Evaluation to date: none. Treatment to date: OTC analgesics which are not very effective.  The following portions of the patient's history were reviewed and updated as appropriate: allergies, current medications, past family history, past medical history, past social history, past surgical history and problem list.  Review of Systems Pertinent items are noted in HPI.    Objective:    BP (!) 149/71 (BP Location: Right Arm, Patient Position: Sitting, Cuff Size: Normal)    Pulse 80    Temp 98 F (36.7 C) (Temporal)    Resp 18    Ht 5' 5.75" (1.67 m)    Wt 166 lb 3.2 oz (75.4 kg)    SpO2 100%    BMI 27.03 kg/m     BP (!) 149/71 (BP Location: Right Arm, Patient Position: Sitting, Cuff Size: Normal)    Pulse 80    Temp 98 F (36.7 C) (Temporal)    Resp 18    Ht 5' 5.75" (1.67 m)    Wt 166 lb 3.2 oz (75.4 kg)    SpO2 100%    BMI 27.03 kg/m   General Appearance:    Alert, cooperative, no distress, appears stated age  Head:    Normocephalic, without obvious abnormality, atraumatic  Neck:   Supple, symmetrical, trachea midline    Back:     Symmetric, no curvature, ROM normal, no CVA tenderness  Lungs:      respirations unlabored   Heart:    Regular rate   Extremities:   Extremities normal, atraumatic, no cyanosis or edema  Pulses:   2+ and symmetric all extremities  Skin:   Skin color, texture, turgor normal, no rashes or lesions   Right foot:  lcallus to lateral plantar aspect  Left foot:  callus lateral plantar aspect         Assessment:    bilateral foot callus    Plan:    Educational material distributed. OTC analgesics as needed. podiatry referral

## 2019-12-11 ENCOUNTER — Telehealth: Payer: Self-pay

## 2019-12-11 NOTE — Telephone Encounter (Signed)
Copied from CRM [id number]. Topic: Appointment Scheduling - Scheduling Inquiry for Clinic >> Dec 11, 2019  2:12 PM Randol Kern wrote: Pt would like to schedule an appt to renew her orange card. Pt's son can be contacted at :[phone])

## 2019-12-17 ENCOUNTER — Encounter: Payer: Self-pay | Admitting: Podiatry

## 2019-12-17 ENCOUNTER — Ambulatory Visit: Payer: No Typology Code available for payment source | Admitting: Podiatry

## 2019-12-17 ENCOUNTER — Other Ambulatory Visit: Payer: Self-pay

## 2019-12-17 DIAGNOSIS — L84 Corns and callosities: Secondary | ICD-10-CM

## 2019-12-17 NOTE — Patient Instructions (Signed)
Corn and Callus remover. Over-the-counter at any pharmacy.

## 2019-12-19 ENCOUNTER — Other Ambulatory Visit: Payer: Self-pay

## 2019-12-19 ENCOUNTER — Ambulatory Visit: Payer: Medicaid Other | Attending: Family Medicine

## 2019-12-19 NOTE — Progress Notes (Signed)
   Subjective: 59 y.o. female presenting to the office today as a new patient for evaluation of pain and tenderness to the subfifth MTPJ of the bilateral feet.  Patient states the calluses have been present for several months.  She presents for further treatment and evaluation   Past Medical History:  Diagnosis Date  . Hyperlipidemia      Objective:  Physical Exam General: Alert and oriented x3 in no acute distress  Dermatology: Hyperkeratotic lesion(s) present on the subfifth MTPJ bilateral. Pain on palpation with a central nucleated core noted. Skin is warm, dry and supple bilateral lower extremities. Negative for open lesions or macerations.  Vascular: Palpable pedal pulses bilaterally. No edema or erythema noted. Capillary refill within normal limits.  Neurological: Epicritic and protective threshold grossly intact bilaterally.   Musculoskeletal Exam: Pain on palpation at the keratotic lesion(s) noted. Range of motion within normal limits bilateral. Muscle strength 5/5 in all groups bilateral.  Assessment: 1.  Preulcerative calluses subfifth MTPJ bilateral   Plan of Care:  1. Patient evaluated 2. Excisional debridement of keratoic lesion(s) using a chisel blade was performed without incident.  3. Dressed area with light dressing. 4. Patient is to return to the clinic PRN.   Felecia Shelling, DPM [facility]  Dr. Felecia Shelling, DPM    [address] [place], [place] [address]                Office [phone]  Fax [phone]

## 2019-12-20 ENCOUNTER — Encounter: Payer: Self-pay | Admitting: Podiatry

## 2019-12-20 ENCOUNTER — Other Ambulatory Visit: Payer: Medicaid Other

## 2019-12-20 DIAGNOSIS — Z20822 Contact with and (suspected) exposure to covid-19: Secondary | ICD-10-CM

## 2019-12-20 NOTE — Progress Notes (Signed)
n

## 2019-12-22 ENCOUNTER — Telehealth: Payer: Self-pay

## 2019-12-22 LAB — SARS-COV-2, NAA 2 DAY TAT

## 2019-12-22 LAB — NOVEL CORONAVIRUS, NAA: SARS-CoV-2, NAA: NOT DETECTED

## 2019-12-22 NOTE — Telephone Encounter (Signed)
Pt's son-in-law called for covid results- advised that results are not back.

## 2020-01-01 ENCOUNTER — Ambulatory Visit: Payer: Self-pay | Attending: Nurse Practitioner

## 2020-01-21 ENCOUNTER — Ambulatory Visit: Payer: No Typology Code available for payment source | Admitting: Nurse Practitioner

## 2020-01-24 ENCOUNTER — Telehealth: Payer: No Typology Code available for payment source | Admitting: Nurse Practitioner

## 2020-07-08 IMAGING — DX DG KNEE COMPLETE 4+V*L*
4 series · 4 of 4 positions shown · non-contrast
Comparison: Radiographs 02/06/2018.

CLINICAL DATA: Chronic knee pain.

EXAM:
LEFT KNEE - COMPLETE 4+ VIEW

[knee ap]
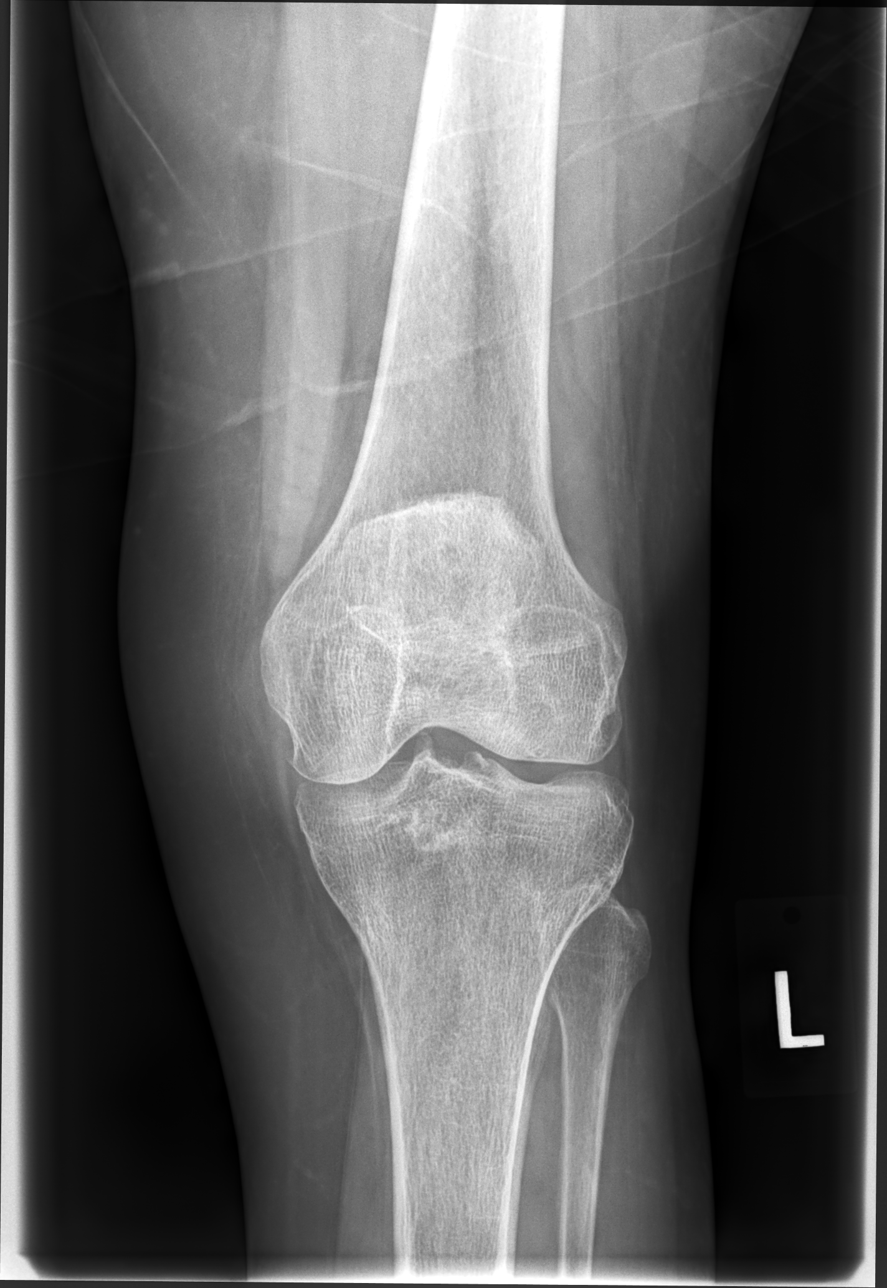

[knee lat]
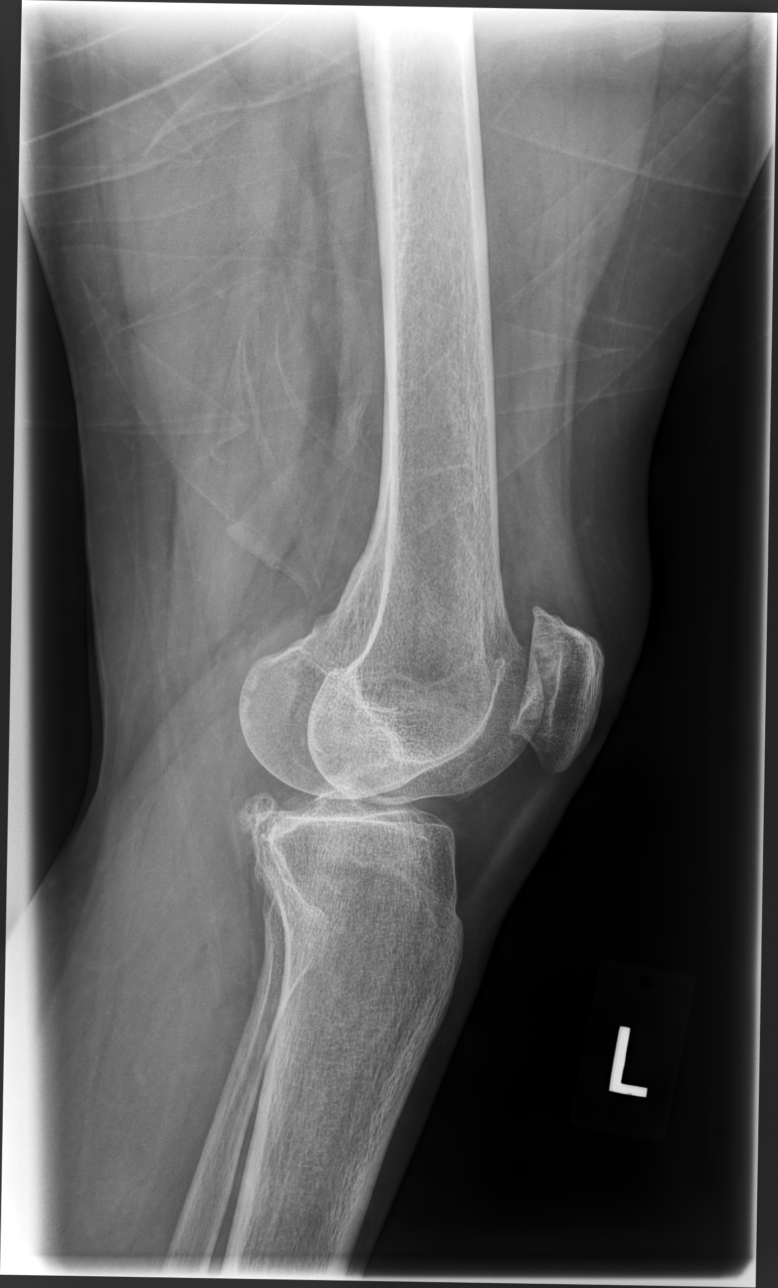

[knee mlo (1 of 2)]
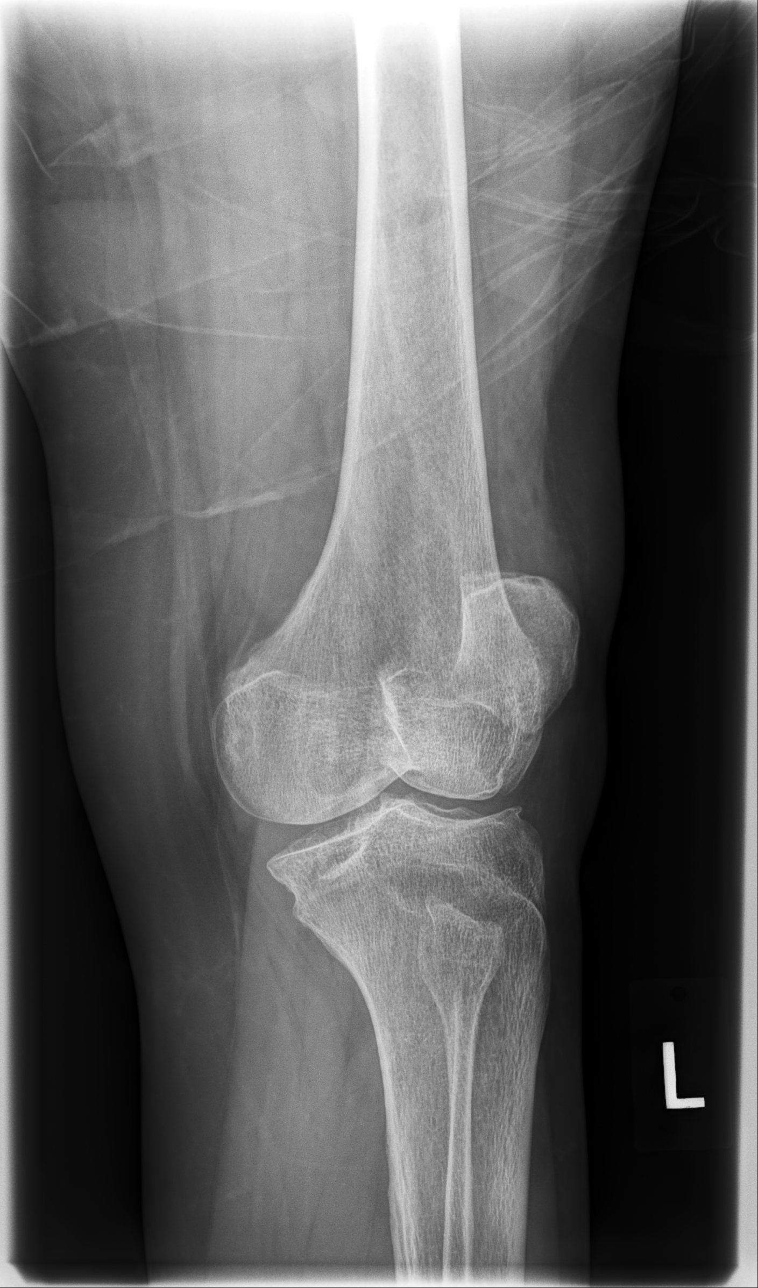

[knee mlo (2 of 2)]
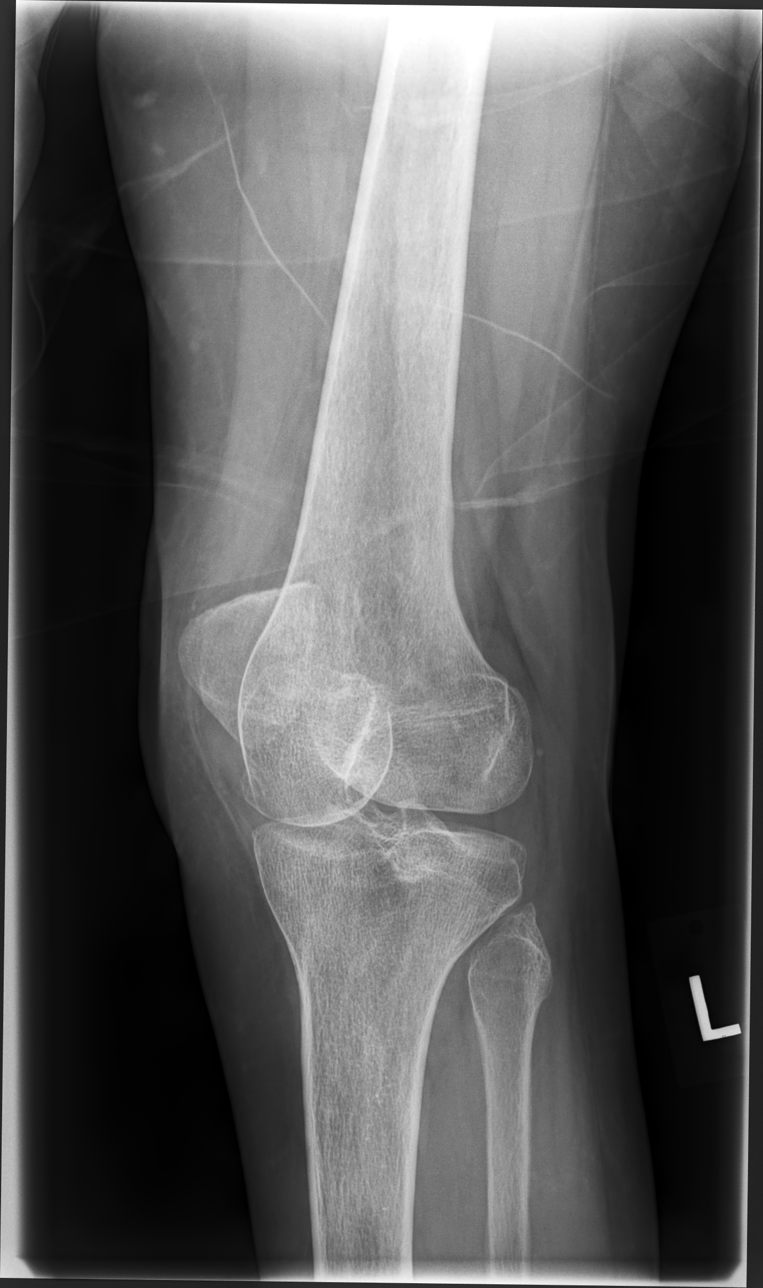

[4 of 4 positions shown; findings below may reference images not displayed]

FINDINGS: The mineralization and alignment are normal. There is no evidence of
acute fracture or dislocation. Mild tricompartmental degenerative
changes, greatest within the patellofemoral compartment. No
significant joint effusion. The previously questioned small foreign
body laterally is no longer visualized.
IMPRESSION: Mild tricompartmental degenerative changes. No acute osseous
findings.

## 2020-12-15 ENCOUNTER — Ambulatory Visit: Payer: No Typology Code available for payment source

## 2020-12-19 ENCOUNTER — Ambulatory Visit: Payer: No Typology Code available for payment source

## 2020-12-24 ENCOUNTER — Encounter: Payer: Self-pay | Admitting: Nurse Practitioner

## 2020-12-24 ENCOUNTER — Other Ambulatory Visit: Payer: Self-pay

## 2020-12-24 ENCOUNTER — Ambulatory Visit (INDEPENDENT_AMBULATORY_CARE_PROVIDER_SITE_OTHER): Payer: No Typology Code available for payment source | Admitting: Nurse Practitioner

## 2020-12-24 VITALS — BP 143/63 | HR 69 | Temp 97.8°F | Ht 65.75 in | Wt 170.4 lb

## 2020-12-24 DIAGNOSIS — R7303 Prediabetes: Secondary | ICD-10-CM

## 2020-12-24 DIAGNOSIS — Z1329 Encounter for screening for other suspected endocrine disorder: Secondary | ICD-10-CM

## 2020-12-24 DIAGNOSIS — E782 Mixed hyperlipidemia: Secondary | ICD-10-CM

## 2020-12-24 DIAGNOSIS — Z23 Encounter for immunization: Secondary | ICD-10-CM

## 2020-12-24 LAB — POCT GLYCOSYLATED HEMOGLOBIN (HGB A1C)
HbA1c POC (<> result, manual entry): 5.6 % (ref 4.0–5.6)
HbA1c, POC (controlled diabetic range): 5.6 % (ref 0.0–7.0)
HbA1c, POC (prediabetic range): 5.6 % — AB (ref 5.7–6.4)
Hemoglobin A1C: 5.6 % (ref 4.0–5.6)

## 2020-12-24 NOTE — Patient Instructions (Addendum)
Mammogram [id number] ??????? ???????? ?????? ?? ??? 40-64 ????? Preventive Care 59-59 Years Old, Female ???? ????? ??????? ???????? ??? ???????? ??? ?????? ??????? ???????? ?? ???? ??????? ?????? ??? ?? ???? ???????? ?????? ????????. ?????? ?????? ??????? ???????? ????? ?????? ??????? ????? ??????. ???? ???? ????? ??????? ????????? ?????????? ?? ?????? ???? ??????? ?????? ??? ???:  ??????? ??????? ??? ?? ???: ? ???????? ?????? ???? ????? ??? ?? ???. ? ??????? ?????? ????????. ? ????? ????? ???????.  ?????? ?????? ???????? ??? ?? ???: ? ???? ?????. ? ??? ????????? ????????. ? ??????? ???????? ????????. ? ?????? ??????? ??????? ??????? ????. ? ?????? ?????? ?????? ???????.  ????? ???????? ??? ?? ???: ? ????? ????????? ???????? ?? ????????? ?? ????? ?? ????????. ? ??????? ?????? ??? ??????? ???????. ? ?????? ??????? ????????? ?????? ????? ??????. ? ????? ??????. ? ??????? ?????? ??????? ?? ?????. ? ???????? ???? ????? ???????? ??? ??????? ???? ?????? ????? ???????. ????? ???????? ?????? ???? ??????? ??????:  ????? ??????. ????? ??????? ???? ???????? ????? ???? ???? ????? (BMI). ????? ???? ????? (BMI) ????? ?????? ?? ????? ?? ?????? ?????? ?? ??.  ???? ?????. ?? ???? ?? ?????. ????? ??? ?????? ????? ?? ??? ??? ???? ?? ?????? ?????? ?? ??? ??? ????? ?? ?????? ????????? ??????? ?????? ?????? ??? ?????? ?? ????? ?????? ??????? ??? ????.  ???? ????? ????? ???? ????.  ???? ????? ?????.  ????? ??????? ??? ??? ??????. ?? ????????? ???? ????????  ????? ?? ???? ?? ??? ???? ??? ???? ???? ?????. ?????? ??????? ??????? ?????? ??????? ??? ????????? ??? ????? ??????? ????? ?????? ?????? ???? ??????? ?????? ??? ????? ?? ???? ?????. ?? ???????? ???? ????? ??? ???????? ?????? ?? ???? ????? ??????? ?????? ?????? ???? ????? ?????? ?? ?????? ???? ?????. ????? ???? ???:  ??????? ?????? ????????????.  ???? ??????. ????? ??? ???? ??? ???? (????????) ??? ???????? ?? ????? ?????? ????? ?????  (????).  ??? ????? ??????? ???.  ????? ?????? ????? ??????? "?".  ????? ?????? ????? ??????? "?".  ????? ????? ?? ????? ??? ??????? ?????? (HIV).  ???? ????? ?? ?????? ???????? ??? ??????? ?????? (STI)? ??? ???? ?? ???????? ???? ??????? ???? ??????? ?? ??????.  ??? ????? ?????.  ??? ????? ?? ????? ??????? ?????????.  ???? ????? ????????. ????? ???? ??????? ?????? ?? ???? ???? ??? ?? ????? ??? ????? ???? ????? ???????? ??????. ?????? ??? ??? ?? ??? ??? ????? ????? ???? ?? ??????? ??????? ?????? ?????.  ???? ????? ?? ??????? ??? ????? ?? BRCA. ????? ????? ??? ????? ??? ??? ????? ????? ???? ?? ??????? ??????? ?????? ????? ?? ?????? ?? ????? ????? ?? ?????? ?????????.  ??? ????? ??????. ????? ??? ???? ????? ??????. ????? ??? ???? ??????? ?????? ??????? ??????? ??????? ??????? ???? ???????? ????? ?????? ??? ????? ?????? ?? ??????. ????? ??? ????????? ?? ??????: ????? ??????   ????? ?????? ??????? ????? ??? ??????? ?????????? ??????? ??????? ??????? ??????????? ????? ?????? ??????? ??????? ????? ?????.  ?????? ??????????? ???????? ??? ?????? ???? ??????? ?????? ??????? ???.  ?? ??????? ????????? ??????? ?? ??????? ???????: ? ??? ?????? ???? ??????? ?????? ??????? ?????? ???? ????? ?????????. ? ??? ???? ??????? ?? ?????? ?????? ?? ??????? ???? ?? ?????? ??????.  ??????? ?????? ????????? ????????: ? ?????? ?? ????? ??? ????????? ?????? ?? ?????? ??????? ?????? ??? ??????. ? ????? ???? ?????? ?? ?? ????? ???????. ?? ???????? ???????? ????? ??????? ?????? ???? ??? 12 ????? (355 ??) ?? ?????? ?? ??? ??? 5 ?????? (148 ??) ?? ?????? ?? ??? ??? ????? ???? (44 ??) ?? ????????? ???????? ??????. ??? ??????  ????? ??????? ???????? ?????? ????? ??? ????????? ?????? ??????. ??????? ???? ??????? ??? ????? ??????.  ????? ???????? ????  30 ????? ??? ????? ????? 5 ???? ?? ???? ?? ?????.  ?? ??????? ?????? ????? ??? ????????? ?? ?????. ????? ??? ??????? ???? ????? ?????? ??????? ???????????? ??? ??????? ???????????.  ??????? ???? ??????? ?????? ??? ???? ????? ??? ???????? ??????? ?? ???????.  ?? ?????? ????????.  ?????? ???????? ??????? ?? ???? ?????? ?????. ????? ??? ??????? ?????? ?????? ?? ????? ???? ?? ????? ??????? ??????? ?? ??????? ???????? ?????? (STIs).  ??? ???? ?? ?????? ?? ?????? ???????? ????? ?????? ?????. ??? ???? ?????? ?????? ?????? ?????? ?? ???? ??????? ?????? ??????? ??? ????????? ????? ?????.  ?????? ???????? ??? ??? ??????? ???? ??????? ??????. ?????? ??? ??? ??? ???????? ???? ??? ?? ??????? ??????? ????????. ??????? ???? ??????? ?????? ?????? ?? ??? ??? ????? ???????? ?????? ??? ????? ??????? ?? ??.  ????? ?????? ??? ?????? ???? ?????? ??? ??????? ??????? ???: ? ?????? ?????? ?? ?????? ?? ???????? ??? ????????. ? ????? ????????. ? ?????? ??? ??? ????? ??. ? ???? ??? ????? ?? ??????? ??????.  ???? ?????? ?????? ??? ????????? ??????? ?? ????? ?????. ???????  ????? ??? ?????? ???? ?????? ?? ????? ????? ??????? ?? ???? ???????.  ?? ?????? ???????: ? ??? ????? ????????? ????????. ?? ????? ??????? ?? ??? ??? ???? ????????? ??? ???????. ? ????? ????? ????? ?? ????? ?????. ? ?????? ???????? ??????? ??????. ? ??? ????? ?? ???? ?? ?????? ???? ??? ?????.  ????? ??? ?????? ???? ?????? ?? ??????? ???????? ?? ????? ??????? ????????.  ??? ??? ????? ???? ???? ?? ??????? ????? ?? ????? ??????? ??????? ?????? ???????.  ????? ???????? ??? ???? ??????? ?????? ???? ?? ????. ?? ?????? ????????  ????? ???? ??????? ?????? ??????? ??? ??? ????? ?????? ?????? ??? ???????.  ????? ???? ??????? ?????? ?? ??? ????? ????? ?????? ???? ?????? ????????.  ????? ??? ?????? ??? ???? ????????? ?? ????????. ??? ????? ?? ??? ????????? ?? ???? ?????? ????????? ???? ?????? ???? ??????? ??????. ???? ?? ?????? ??? ????? ???? ?? ???? ?? ???? ??????? ??????.? Document Revised: 07/16/2020 Document Reviewed: 07/16/2020 Elsevier Patient Education  [place].

## 2020-12-24 NOTE — Progress Notes (Deleted)
[facility] [address] [place], [place]  [address] Phone:  [phone]   Fax:  [phone]   Established Patient Office Visit  Subjective:  Patient ID: April Bell, female    DOB: 02-11-1960  Age: 60 y.o. MRN: [id number]  CC:  Chief Complaint  Patient presents with   Follow-up    Pt is here today for her follow up visit.  No concerns or issues.    HPI April Bell presents for follow up. A former patient of NP Stroud. She  has a past medical history of Hyperlipidemia.    She is in today for a follow-up.  She reports that she does have history of headaches with some vision changes.  She was supposed to be seen by ophthalmology.  She reports that she has had no communication with this referral.    Past Medical History:  Diagnosis Date   Hyperlipidemia     History reviewed. No pertinent surgical history.  Family History  Problem Relation Age of Onset   Hypertension Mother    Breast cancer Neg Hx     Social History   Socioeconomic History   Marital status: Married    Spouse name: Not on file   Number of children: Not on file   Years of education: Not on file   Highest education level: Not on file  Occupational History   Not on file  Tobacco Use   Smoking status: Former   Smokeless tobacco: Never  Vaping Use   Vaping Use: Some days   Substances: Flavoring  Substance and Sexual Activity   Alcohol use: No    Alcohol/week: 0.0 standard drinks   Drug use: No   Sexual activity: Yes    Birth control/protection: None  Other Topics Concern   Not on file  Social History Narrative   ** Merged History Encounter **       Social Determinants of Health   Financial Resource Strain: Not on file  Food Insecurity: Not on file  Transportation Needs: Not on file  Physical Activity: Not on file  Stress: Not on file  Social Connections: Not on file  Intimate Partner Violence: Not on file    Outpatient Medications  Prior to Visit  Medication Sig Dispense Refill   diclofenac Sodium (VOLTAREN) 1 % GEL Apply 4 g topically 4 (four) times daily. To affected joint. 100 g 11   amoxicillin (AMOXIL) 500 MG capsule Take 500 mg by mouth 3 (three) times daily. (Patient not taking: Reported on 12/24/2020)     ibuprofen (ADVIL) 200 MG tablet Take 200 mg by mouth every 6 (six) hours as needed. (Patient not taking: Reported on 12/24/2020)     No facility-administered medications prior to visit.    No Known Allergies  ROS Review of Systems    Objective:    Physical Exam Vitals reviewed: Interpreter. Exam conducted with a chaperone present.  HENT:     Head: Normocephalic and atraumatic.     Nose: Nose normal.  Cardiovascular:     Rate and Rhythm: Normal rate and regular rhythm.     Pulses: Normal pulses.     Heart sounds: Normal heart sounds.  Pulmonary:     Effort: Pulmonary effort is normal.     Breath sounds: Normal breath sounds.  Abdominal:     Comments: Increased abdominal girth  Musculoskeletal:        General: Normal range of motion.     Cervical back: Normal  range of motion.  Skin:    General: Skin is warm and dry.     Capillary Refill: Capillary refill takes less than 2 seconds.  Neurological:     General: No focal deficit present.     Mental Status: She is alert and oriented to person, place, and time.  Psychiatric:        Mood and Affect: Mood normal.        Behavior: Behavior normal.        Thought Content: Thought content normal.        Judgment: Judgment normal.    BP (!) 143/63    Pulse 69    Temp 97.8 F (36.6 C)    Ht 5' 5.75" (1.67 m)    Wt 170 lb 6.4 oz (77.3 kg)    SpO2 98%    BMI 27.71 kg/m  Wt Readings from Last 3 Encounters:  12/24/20 170 lb 6.4 oz (77.3 kg)  12/10/19 166 lb 3.2 oz (75.4 kg)  07/25/19 161 lb (73 kg)     There are no preventive care reminders to display for this patient.  There are no preventive care reminders to display for this patient.  Lab  Results  Component Value Date   TSH 1.070 09/19/2017   Lab Results  Component Value Date   WBC 4.6 02/26/2019   HGB 14.4 02/26/2019   HCT 43.2 02/26/2019   MCV 88 02/26/2019   PLT 244 02/26/2019   Lab Results  Component Value Date   NA 140 07/25/2019   K 4.2 07/25/2019   CO2 22 09/19/2017   GLUCOSE 107 (H) 07/25/2019   BUN 14 07/25/2019   CREATININE 0.67 07/25/2019   BILITOT 0.2 07/25/2019   ALKPHOS 72 07/25/2019   AST 16 07/25/2019   ALT 15 09/19/2017   PROT 6.9 07/25/2019   ALBUMIN 4.1 07/25/2019   CALCIUM 9.3 07/25/2019   Lab Results  Component Value Date   CHOL 237 (H) 07/25/2019   Lab Results  Component Value Date   HDL 87 07/25/2019   Lab Results  Component Value Date   LDLCALC 134 (H) 07/25/2019   Lab Results  Component Value Date   TRIG 96 07/25/2019   Lab Results  Component Value Date   CHOLHDL 2.7 07/25/2019   Lab Results  Component Value Date   HGBA1C 5.6 12/24/2020   HGBA1C 5.6 12/24/2020   HGBA1C 5.6 (A) 12/24/2020   HGBA1C 5.6 12/24/2020   Barbette Merino, NP  Assessment & Plan:   Problem List Items Addressed This Visit       Other   Prediabetes - Primary Consider home glucose monitoring Weight loss at least 5% of current body weight is can be achieved with lifestyle modification dietary changes and regular daily exercise Encourage blood pressure control goal <120/80 and maintaining total cholesterol <200 Follow-up every 3 to 6 months for reevaluation Education material provided    Relevant Orders   HgB A1c (Completed)   Comp. Metabolic Panel (12)   Hyperlipidemia Persistent no current treatment at this time Encouraged heart healthy diet   Relevant Orders   Lipid panel   Other Visit Diagnoses     Needs flu shot       Relevant Orders   Flu Vaccine QUAD 47mo+IM (Fluarix, Fluzone & Alfiuria Quad PF) (Completed)   Need for influenza vaccination       Screening for thyroid disorder       Relevant Orders   TSH  No  orders of the defined types were placed in this encounter.   Follow-up: Return for Physcial VISIT,EST,40-64 [67893] when available .    Barbette Merino, NP

## 2020-12-24 NOTE — Progress Notes (Signed)
° °[facility] °[address] °[place], [place]  [address] °Phone:  [phone]   Fax:  [phone] ° ° °Established Patient Office Visit ° °Subjective:  °Patient ID: April Bell, female    DOB: 06/23/1960  Age: 60 y.o. MRN: [id number] ° °CC:  °Chief Complaint  °Patient presents with  ° Follow-up  °  Pt is here today for her follow up visit.  °No concerns or issues.  ° ° °HPI °April Bell presents for follow up. A former patient of NP Stroud. She  has a past medical history of Hyperlipidemia.  ° ° °She is in today for a follow-up.  She reports that she does have history of headaches with some vision changes.  She was supposed to be seen by ophthalmology.  She reports that she has had no communication with this referral.   ° °Past Medical History:  °Diagnosis Date  ° Hyperlipidemia   ° ° °History reviewed. No pertinent surgical history. ° °Family History  °Problem Relation Age of Onset  ° Hypertension Mother   ° Breast cancer Neg Hx   ° ° °Social History  ° °Socioeconomic History  ° Marital status: Married  °  Spouse name: Not on file  ° Number of children: Not on file  ° Years of education: Not on file  ° Highest education level: Not on file  °Occupational History  ° Not on file  °Tobacco Use  ° Smoking status: Former  ° Smokeless tobacco: Never  °Vaping Use  ° Vaping Use: Some days  ° Substances: Flavoring  °Substance and Sexual Activity  ° Alcohol use: No  °  Alcohol/week: 0.0 standard drinks  ° Drug use: No  ° Sexual activity: Yes  °  Birth control/protection: None  °Other Topics Concern  ° Not on file  °Social History Narrative  ° ** Merged History Encounter **  °    ° °Social Determinants of Health  ° °Financial Resource Strain: Not on file  °Food Insecurity: Not on file  °Transportation Needs: Not on file  °Physical Activity: Not on file  °Stress: Not on file  °Social Connections: Not on file  °Intimate Partner Violence: Not on file  ° ° °Outpatient Medications  Prior to Visit  °Medication Sig Dispense Refill  ° diclofenac Sodium (VOLTAREN) 1 % GEL Apply 4 g topically 4 (four) times daily. To affected joint. 100 g 11  ° amoxicillin (AMOXIL) 500 MG capsule Take 500 mg by mouth 3 (three) times daily. (Patient not taking: Reported on 12/24/2020)    ° ibuprofen (ADVIL) 200 MG tablet Take 200 mg by mouth every 6 (six) hours as needed. (Patient not taking: Reported on 12/24/2020)    ° °No facility-administered medications prior to visit.  ° ° °No Known Allergies ° °ROS °Review of Systems ° °  °Objective:  °  °Physical Exam °Vitals reviewed: Interpreter. Exam conducted with a chaperone present.  °HENT:  °   Head: Normocephalic and atraumatic.  °   Nose: Nose normal.  °Cardiovascular:  °   Rate and Rhythm: Normal rate and regular rhythm.  °   Pulses: Normal pulses.  °   Heart sounds: Normal heart sounds.  °Pulmonary:  °   Effort: Pulmonary effort is normal.  °   Breath sounds: Normal breath sounds.  °Abdominal:  °   Comments: Increased abdominal girth  °Musculoskeletal:     °   General: Normal range of motion.  °   Cervical back: Normal   range of motion.  °Skin: °   General: Skin is warm and dry.  °   Capillary Refill: Capillary refill takes less than 2 seconds.  °Neurological:  °   General: No focal deficit present.  °   Mental Status: She is alert and oriented to person, place, and time.  °Psychiatric:     °   Mood and Affect: Mood normal.     °   Behavior: Behavior normal.     °   Thought Content: Thought content normal.     °   Judgment: Judgment normal.  ° ° °BP (!) 143/63    Pulse 69    Temp 97.8 °F (36.6 °C)    Ht 5' 5.75" (1.67 m)    Wt 170 lb 6.4 oz (77.3 kg)    SpO2 98%    BMI 27.71 kg/m²  °Wt Readings from Last 3 Encounters:  °12/24/20 170 lb 6.4 oz (77.3 kg)  °12/10/19 166 lb 3.2 oz (75.4 kg)  °07/25/19 161 lb (73 kg)  ° ° ° °There are no preventive care reminders to display for this patient. ° °There are no preventive care reminders to display for this patient. ° °Lab  Results  °Component Value Date  ° TSH 1.070 09/19/2017  ° °Lab Results  °Component Value Date  ° WBC 4.6 02/26/2019  ° HGB 14.4 02/26/2019  ° HCT 43.2 02/26/2019  ° MCV 88 02/26/2019  ° PLT 244 02/26/2019  ° °Lab Results  °Component Value Date  ° NA 140 07/25/2019  ° K 4.2 07/25/2019  ° CO2 22 09/19/2017  ° GLUCOSE 107 (H) 07/25/2019  ° BUN 14 07/25/2019  ° CREATININE 0.67 07/25/2019  ° BILITOT 0.2 07/25/2019  ° ALKPHOS 72 07/25/2019  ° AST 16 07/25/2019  ° ALT 15 09/19/2017  ° PROT 6.9 07/25/2019  ° ALBUMIN 4.1 07/25/2019  ° CALCIUM 9.3 07/25/2019  ° °Lab Results  °Component Value Date  ° CHOL 237 (H) 07/25/2019  ° °Lab Results  °Component Value Date  ° HDL 87 07/25/2019  ° °Lab Results  °Component Value Date  ° LDLCALC 134 (H) 07/25/2019  ° °Lab Results  °Component Value Date  ° TRIG 96 07/25/2019  ° °Lab Results  °Component Value Date  ° CHOLHDL 2.7 07/25/2019  ° °Lab Results  °Component Value Date  ° HGBA1C 5.6 12/24/2020  ° HGBA1C 5.6 12/24/2020  ° HGBA1C 5.6 (A) 12/24/2020  ° HGBA1C 5.6 12/24/2020  ° °Yerania Chamorro M Charistopher Rumble, NP  °Assessment & Plan:  ° °Problem List Items Addressed This Visit   ° °  ° Other  ° Prediabetes - Primary °Consider home glucose monitoring °Weight loss at least 5% of current body weight is can be achieved with lifestyle modification dietary changes and regular daily exercise °Encourage blood pressure control goal <120/80 and maintaining total cholesterol <200 °Follow-up every 3 to 6 months for reevaluation °Education material provided °  ° Relevant Orders  ° HgB A1c (Completed)  ° Comp. Metabolic Panel (12)  ° Hyperlipidemia °Persistent no current treatment at this time °Encouraged heart healthy diet  ° Relevant Orders  ° Lipid panel  ° °Other Visit Diagnoses   ° ° Needs flu shot      ° Relevant Orders  ° Flu Vaccine QUAD 6mo+IM (Fluarix, Fluzone & Alfiuria Quad PF) (Completed)  ° Need for influenza vaccination      ° Screening for thyroid disorder      ° Relevant Orders  ° TSH  ° °  ° ° °  No  orders of the defined types were placed in this encounter. ° ° °Follow-up: Return for Physcial VISIT,EST,40-64 [99396] when available .  ° ° °Devyon Keator M Solly Derasmo, NP °

## 2020-12-25 LAB — COMP. METABOLIC PANEL (12)
AST: 16 IU/L (ref 0–40)
Albumin/Globulin Ratio: 1.4 (ref 1.2–2.2)
Albumin: 4.3 g/dL (ref 3.8–4.9)
Alkaline Phosphatase: 89 IU/L (ref 44–121)
BUN/Creatinine Ratio: 14 (ref 9–23)
BUN: 11 mg/dL (ref 6–24)
Bilirubin Total: <0.2 mg/dL (ref 0.0–1.2)
Calcium: 9.5 mg/dL (ref 8.7–10.2)
Chloride: 103 mmol/L (ref 96–106)
Creatinine, Ser: 0.77 mg/dL (ref 0.57–1.00)
Globulin, Total: 3 g/dL (ref 1.5–4.5)
Glucose: 91 mg/dL (ref 70–99)
Potassium: 4.1 mmol/L (ref 3.5–5.2)
Sodium: 142 mmol/L (ref 134–144)
Total Protein: 7.3 g/dL (ref 6.0–8.5)
eGFR: 89 mL/min/{1.73_m2} (ref >59)

## 2020-12-25 LAB — LIPID PANEL
Chol/HDL Ratio: 2.9 ratio (ref 0.0–4.4)
Cholesterol, Total: 234 mg/dL — ABNORMAL HIGH (ref 100–199)
HDL: 82 mg/dL (ref >39)
LDL Chol Calc (NIH): 134 mg/dL — ABNORMAL HIGH (ref 0–99)
Triglycerides: 107 mg/dL (ref 0–149)
VLDL Cholesterol Cal: 18 mg/dL (ref 5–40)

## 2020-12-25 LAB — TSH: TSH: 0.975 u[IU]/mL (ref 0.450–4.500)

## 2021-01-07 ENCOUNTER — Ambulatory Visit: Payer: Self-pay | Attending: Nurse Practitioner

## 2021-01-07 ENCOUNTER — Other Ambulatory Visit: Payer: Self-pay

## 2021-01-07 ENCOUNTER — Ambulatory Visit: Payer: No Typology Code available for payment source

## 2021-01-23 ENCOUNTER — Encounter: Payer: Self-pay | Admitting: Nurse Practitioner

## 2021-01-23 ENCOUNTER — Ambulatory Visit (INDEPENDENT_AMBULATORY_CARE_PROVIDER_SITE_OTHER): Payer: No Typology Code available for payment source | Admitting: Nurse Practitioner

## 2021-01-23 ENCOUNTER — Other Ambulatory Visit: Payer: Self-pay

## 2021-01-23 VITALS — BP 132/63 | HR 68 | Temp 98.2°F | Ht 65.75 in | Wt 171.6 lb

## 2021-01-23 DIAGNOSIS — Z973 Presence of spectacles and contact lenses: Secondary | ICD-10-CM

## 2021-01-23 DIAGNOSIS — E782 Mixed hyperlipidemia: Secondary | ICD-10-CM

## 2021-01-23 DIAGNOSIS — K219 Gastro-esophageal reflux disease without esophagitis: Secondary | ICD-10-CM

## 2021-01-23 DIAGNOSIS — Z741 Need for assistance with personal care: Secondary | ICD-10-CM

## 2021-01-23 DIAGNOSIS — R7303 Prediabetes: Secondary | ICD-10-CM

## 2021-01-23 DIAGNOSIS — Z1239 Encounter for other screening for malignant neoplasm of breast: Secondary | ICD-10-CM

## 2021-01-23 MED ORDER — PRAVASTATIN SODIUM 40 MG PO TABS
40.0000 mg | ORAL_TABLET | Freq: Every day | ORAL | 1 refills | Status: DC
  Filled 2021-01-26: qty 90, 90d supply, fill #0
  Filled 2021-01-27: qty 30, 30d supply, fill #0

## 2021-01-23 MED ORDER — OMEPRAZOLE 40 MG PO CPDR
40.0000 mg | DELAYED_RELEASE_CAPSULE | Freq: Every day | ORAL | 3 refills | Status: DC
Start: 2021-01-23 — End: 2021-10-19
  Filled 2021-01-26 – 2021-01-27 (×2): qty 30, 30d supply, fill #0
  Filled 2021-03-17: qty 30, 30d supply, fill #1
  Filled 2021-04-20: qty 30, 30d supply, fill #2
  Filled 2021-06-28: qty 30, 30d supply, fill #3

## 2021-01-23 NOTE — Patient Instructions (Signed)
???????? ?????? ???? ???????? ?????? ??????? ??? ???????? Food Choices for Gastroesophageal Reflux Disease, Adult ????? ????? ?? ???????? ?????? ??????? (GERD)? ???? ??????? ???? ???????? ?????? ????? ?????? ???? ??????. ????? ?? ????? ?????? ??????? ??????? ??? ????? ???????? ?????? ??????? (GERD). ??? ????? ????????? ?????? ?? ?????? ???????? ???????? ??? ?????? ????? ????. ?? ??????? ?????? ??? ?????? ????? ?????? ????????? ????????  ???? ?? ??????? ???? ?? ????? ??? ??? ?????? ?? ?????? ???????. ??????? ???? ????? ??? ??? ?? 5% ?? ?????? ??????? (DV) ?? ?????? ?0 ???? ?? ?????? ????????? ?? ????? ?? ??????. ?????  ??? ??? ?????? ???????? ????? ???? ??? ?????. ??? ???? ??? ??????? ?? ??????? ?? ?????? ?? ?????. ??? ?? ???? ??? ????? ???? ?? ????? ??? ??????? ?? ?????? ?????.  ???? ??????? ??????? ????? ??????? ???????? ?????? ?????. ????? ???????   ????? ?????? ??????? ????? ????? ??? ????? ????? ?? ?????? ??? ????????? ???????? ??????? ??????? ??????? ????? ?????? ?????? ??????? ??????? ?? ?????? ???????? ???????.  ????? ????? ????? ??????? ????? ?? ????? ???? ????? ????? ?? ???. ????? ?????? ????? ?????? ????????. ???? ???????? ?? ????????? ???? 2-3 ????? ??? ????? ??????.  ???? ??? ????? ????? ??????? ????? ?????? ??? ?????? ????? ?????? ?? ??????? ???????.  ??? ?? ????? ??????? ??????? ??? ?????? ??????? ??????? ??????? (????????).  ???? ?? ??? ???? ??? ??????? ???? ??????? ?????? ??????? ??: ? ??????? ???? ???? ???????. ????? ??? ?? ??? ????. ????? ???? ???? ????? ??????? ???? ???? ???????. ? ?????????. ? ??? ????? ????? ?? ??????? ?? ???????. ? ????? ????? ???? 2-3 ????? ??? ?????. ??? ??????  ???? ??? ???? ?? ?????? ??????. ?????? ???? ??????? ?????? ?? ????? ????? ??????? ??. ??? ??? ????? ??? ????? ?????? ?????? ?? ???? ??????? ?????? ???? ????? ?????? ????.  ???? ???????? ??? ????? 30 ????? ?? 5 ???? ?? ???? ?? ?????? ?? ??? ??????? ???? ??????? ??????.  ???? ?????? ??????? ??????  ??? ????? ??????.  ?? ?????? ?????? ????? ??? ????????? ?? ?????. ????? ??? ???????? ??????? ??????? ?? ??????????? ?? ??? ?????. ????? ???? ??????? ?????? ??? ??? ????? ??? ???????? ??????? ?? ???????.  ???? ??? ??? ??? ?????? ??? ?????. ?????? ???? ??? ?????? ?? ?? ????? ?? ??? ???? ?????? ???? ??? ??????.  ???? ?????? ??????? ?? ????? ??? ?????. ?? ??????? ???? ??? ?? ?????????  ????? ?????? ??????? ????? ???????? ?? ??????? ????????? ??????? ???????? ??????? ??????? ?????? ??????? ??????? ??????? ?? ?????? ???????? ????????. ????? ?? ??? ?? ?????. ???????? ???? ???? ???? ????? ??? ??? ?? ?? ?? ???? ????? ??????? ??? ??? ???. ???? ?? ????? ??????? ?????? ?????? ??????? ?????? ??. ?? ?? ???? ??????? ??????? ????? ??????? ??????? ?? ????????? ?????????? ?????? ???. ??? ????? ??????? ??????? ?????? ?????? ?? ?????????. ?? ??????? ?????? ??????? ?? ??????? ?? ????? ??? ??? ??????? ?? ???? ?? ???????? ??? ????? ??? ???????? ?????? ??????? (GERD). ?????? ?? ??? ?? ?????. ???? ?? ??????? ??????? ?????? ????? ?? ??????? ??? ????? ??????? ???? ??? ?????? ???? ????? ?? ????. ??????? ???? ??????? ???????? ????? ?????. ?? ????? ????? ???????. ??????? ???? ???????? ?? ????? ??? ??? ??????? ??????? ??? ???????? ??????? ???? ????????? ????????. ????????? ????????? ??????? ????? ??????. ??????? ???????. ???? ???????? ???????? ????? ?????. ???? ????????? ???? ????? ???????. ??????? ???? ???????? ?? ???? ??? ??? ???????? ??????? ??????? ??????? ????? ?????? ?????? ?????? ?????. ?????? ?????? ?? ??????? ????? ????? ??? ???? ?????. ?????? ??????????? ?????? ?????? ???? ????? ??? ????? ?????? ?? ?????? ??? ????? ?????? ???? ?????? ???? ??????? ?????? ??? ??????? ???? ??????? ?????? ?????? ???????? ???? ??????? ??????. ?????? ?? ?????????? ???????? ??? ?? ??? ??????? ??????? ??????? ??????. ???????? ????? ???????? ??? ??????? ?????? ?????. ?????? ??????? ????? ?????? ???? ??????????. ?????? ??????. ??????. ????????.  ????? ?????. ???? ???. ?????? ??????? ??????. ?????. ???????? (??? ????? ???? ?????? ?? ???????? ?????????). ????? ????????. ????????? ?????? ?????? ???????? ????????? ?? ???????? ???. ????????? ???????. ??????? ??????. ??????? ??????. ????? ??????? ???? ????? ??? ??????? ??????? ??? ???????? ?? ?????? ????. ???? ???????. ????????? ????????. ???????? ??????? ?????????? ????????. ???????. ??????? ????????? ??????. ??????? ???????? ???????. ????? ??????. ?? ??????? ?? ??????? ?? ???????? ???? ???? ???????. ?? ????? ??? ??? ?????? ?? ?????? ?????? ?? ??????? ??????? ??? ???? ?? ???? ??? ???????. ?? ?? ???? ??????? ??????? ????? ??????? ??????? ?? ????????? ?????????? ???? ????? ???? ??????. ??? ????? ??????? ??????? ?????? ?????? ?? ?????????. ???? ??? ??? ??????? ?????? ??? ???? ??????? ?????? ????? ?? ???? ??????? ?????? ??????? ???? ?????? ?? ??????? ?????? ???? ???? ??????? ??????? ??? ????? ??? ???????? ?????? ??????? (  GERD). ???? ?? ???? ??????? ??????? ???????? ??? ?????? ??????? ????? ?? ???? ??????? ?????? ??? ????? ???????. ????? ?????? ??? ?????? ?? ?????????  ??????? ??????? ????????? ????? ?????? ?????? [employer] for Functional Gastrointestinal Disorders):? [email or web address] ????  ????? ????? ?? ???????? ?????? ??????? (GERD)? ???? ??????? ???? ???????? ?????? ????? ?????? ??? ??? ?????? ?? ????? ???????? ?????? ??????? (GERD).  ????? ????? ????? ??????? ????? ?? ????? ???? ????? ????? ?? ???. ????? ?????? ????? ?????? ????????. ???? ???????? ?? ????????? ???? 2-3 ????? ??? ????? ??????.  ???? ??? ????? ????? ??????? ????? ?????? ??? ?????? ????? ?????? ?? ??????? ???????. ??? ????? ?? ??? ????????? ?? ???? ?????? ????????? ???? ?????? ???? ??????? ??????. ???? ?? ?????? ??? ????? ???? ?? ???? ?? ???? ??????? ??????.? Document Revised: 08/06/2019 Document Reviewed: 08/06/2019 Elsevier Patient Education  2022 [employer] ??? ????? ?????? ??????? ???  ???????? Gastroesophageal Reflux Disease, Adult  ???? ???????? ?????? ??????? (GER) ???? ????? ?? ????? ??? ??????? ???? ??? ???? ??????? (??????). ??????? ????? ????? ??? ?????? ???? ?????? ????? ?? ?????? ??????. ??? ??? ???? ???????? ?????? ??????? (GER)? ??? ?????? ???? ?????? ??????? ????? ??????? ??????. ??? ??? ?????? ???????? ??? ????? ?????? ??? ???? ??? ????? ???????? ?????? ??????? (GERD). ??? ??? ??? ???????? ?????? ??????? (GERD):  ???? ??????.   ???? ??????? ?????? ?? ?????.   ???? ????? ??? ??? ???????. ????? ????? ??? ?????? ??????? ???? ?? ???? ???????. ?????? ?????? ???? ?? ???? ??? ???????? ?????? ??????? (GERD) ???? ????? ????? (???) ??? ????? ??????. ?? ????? ??????? ???? ??? ?????? ???? ????? ?? ??????? ??? ?????? ??????? (?????? ???????? ???????? ?? LES). ?????? ???? ?????? ???????? ??????? (LES) ????? ??? ???? ?????? ?? ???? ?????? ??? ??????. ??? ????? ???? ???? ?????? ???????? ??????? (LES) ?? ???? ??? ??????? ????? ?? ????? ??????? ?????? ??? ???? ????? ?????? ???? ?????? ??? ??????. ????? ?? ???? ???? ?????? ???????? ??????? (LES) ???? ???? ?????? ????? ?????? ?????? ????? ??? ?? ???:  ??????? ?????.  ?????.  ??? ?????.  ????? ?????????.  ??? ????????? ?????????? ???????? ??? ?????? ??????????? ?????? ????????. ?? ????? ????? ?????? ????? ?????? ??????? ???? ?????? ?? ??????? ???????:  ????? ?????.  ??????? ??????? ???? ?? ??????? ??????.  ????? ?????? ?????????? ????????????? (NSAID)? ??? ????????????. ?? ?????? ??? ?????? ?? ???????? ???? ????? ??? ?????? ?? ???:  ???? ?? ?? ??????.  ????? ?? ?????? ?? ??? ??? ????? ??????? ????? ???? ?? ?????.  ??????? ???? ????? ?? ????.  ????? ????? ???? ?? ???? ??????.  ??????? ????????? ?? ?????? ?? ?????? ???? ?? ???? ??????.  ??? ?? ?????. ????? ?? ???? ??? ????? ???? ??? ????? ??????. ??? ??? ????? ??? ?????? ???? ??????? ?????? ??????? ?? ??? ??? ???? ???? ?? ?????.  ??? ????? ????? ???? ??? ??????.  ???? ?????  (????) ?? ?????? ?????.  ??? ????? ???????.  ????? ?????. ??? ????? ??? ??????? ???? ????? ??? ?????? ???????? ??? ??????? ?????? ?????? ????? ?????. ?????? ?? ??? ??? ????? ?? ??? ????? ?????? ??????? (GERD) ???? ?? ???? ?? ????? ???? ??????? ?????? ??? ?????? ???????? ??????. ??? ?? ????? ?? ????? ????? ??? ?? ???:  ?????? ??? ?????? ??????? ???????? ???? ?? ?????? ????? (????? ?????).  ?????? ????? ????? ??????? ?? ??????.  ?????? ????? ??? ????? ??????? ??? ??????.  ??? ??? ???????? ?? ??? ???????? ?????? ???? ??? ?????? ????? ?????? ????. ??? ?????? ??? ??????? ????? ????? ???? ??? ?????? ???????? ??? ??? ??? ???????. ??? ???? ???? ??????? ?????? ??? ???:  ??????? ?? ????? ???????.  ???????.  ???????. ???? ????? ?? ?????? ?? ??????? ??? ????? ??????? ???????? ?? ?????????. ???? ??? ????????? ?? ??????: ????? ??????   ???? ?????? ??????? ???? ???? ?? ??????? ??????? ?????? ??????? ??. ??? ???? ??? ???? ??? ????????? ?????????? ???: ? ?????? ?????? ???????? ????????? ?? ???????? ???. ? ????????? ???????? ??? ????. ? ??????? ?????? ?????????? ????????. ? ????????? ??????? ?? ??????. ? ?????????? ????????. ? ????? ?????? ????????. ? ????? ??????. ? ????? ?????. ? ??????? ??????? ?? ??????? ??? ????? ???????? ?????? ?????? ????? ?????? ?????? ????? ??????? ?????? ????? ?????????. ? ????? ??????? ???????? ??? ???????? ???????? ???????? ??????. ? ??????? ???? ????? ??? ??????? ??? ?????? ??????? ????? ??????? ???????? ??????? ??????? ???????. ? ??????? ??????? ??????? ???????? ??? ???????? ???????? ??????? ?????? ??????? ?????????? ????? ??????. ? ?????? ????? ?????? ??? ????? ??? ??????? ??????? ??????? ??????? ?????? ??????? ??? ????? ?????? ???????? ?????? ?????? ??????. ? ?????? ??????? ????? ??????? ??? ?????? ???? ????? ?????? ?????? ???????.  ????? ????? ????? ??????? ????? ?? ??????? ???????.  ???? ??? ????? ????? ?? ??????? ?? ???????.  ???? ????? ????? ????  2-3 ????? ???  ?????.  ???? ????????? ?????? ??? ?????.  ??? ????? ????????? ???????? ??? ????? ??????. ??? ??????   ?? ?????? ?????? ????? ??? ????????? ?? ?????. ????? ??? ???????? ??????? ??????? ?? ??????????? ?? ??? ?????. ????? ???? ??????? ?????? ??? ??? ????? ??? ???????? ??????? ?? ???????.  ???? ??????? ?? ?????? ???? ????? ??? ?????? ?????? ???? ?????? ????? ??? ?????? ?????? ?? ??????. ??? ?? ???? ?????? ??????? ???? ????? ???????? ????? ??????? ????? ??????? ??????.  ?? ???? ????? ?????? ???? ??? ????? ??? ????? ?????. ????? ???? ??????? ???? ??????? ?????? ??? ????? ??? ????? ?????? ???? ?????. ??????? ????  ????? ????? ???????? ???? ???? ??? ??????? ???? ????? ????.  ????? ??????? ??? ??????? ???? ??????? ?????? ??????? ?? ????? ????? ????? ???? ?? ??? ???? ????. ?? ?????? ???????? ?? ???????????? ?? ???? ?? ?????? ?????????? ????????????? (NSAID) ??? ??? ??? ??? ??? ???? ??????? ?????? ??????? ??.  ???? ????? ?????. ???? ??? ?????? ??? ??? ??? ????? ?? ???? ??? ??????.  ???? ????? ????? ?????? 6 ????? ???????? (15 ??). ?????? ???? ?? ????? ?????? ????.  ???? ???? ???????? ??? ??? ???? ??? ????? ??? ???????.  ????? ????? ?????? ????????. ???? ??? ???. ???? ????? ??????? ?????? ?? ??????? ???????:  ?? ???? ??????? ??? ??? ???: ? ????? ?????. ? ????? ????? ??? ??? ?????. ? ????? ?? ????? ?? ??? ??? ?????. ? ???? ??? ???? ??? ?????? ?? ???? ?????. ? ??? ?? ?????.  ??? ?????? ????? ?? ???? ??????. ???? ???????? ??? ????? ?? ??????? ???????:  ?????? ???? ????? ?? ???????? ?? ????? ?? ???? ?? ??????? ?? ?????.  ?????? ????? ?????? ?? ?????? ?? ?????? ?? ??????.  ??????? ???? ?? ?????? ?? ??? ??????.  ???? ???? ????? ?? ????? ?? ????? ?? ???? ???? ?? ??? ??????.  ?????? ???????.  ???? ???? ???? ?? ?? ?? ?? ????.  ?? ????? ????? ?? ????? ?? ?????. ???? ???? ??? ??????? ????? ????? ????? ???? ???? ?????. ??? ?? ????? ?? ??? ???? ??????? ????? ?? ??. ???? ??? ???????? ?????? ??? ?????. ????  ?????? ??????? ??????? (911 ?? ???????? ??????? ?????????). ?? ???? ??????? ????? ??? ????????. ????  ???? ?????? ?????? ??????? ??? ???? ????? ?? ?????? ??? ???? ??????. ??? ???????? ?????? ??????? (GERD) ???? ???? ???? ???????? ??????? ?? ???? ??????? ?????? ?? ?????? ?? ???? ????? ??? ??? ???????.  ????? ????? ???? ??? ?????? ???????? ??? ??? ??? ???????. ??? ???? ???? ??????? ?????? ?????? ??????? ?? ?????? ??????? ???? ??????? ?? ??? ?????? ?? ???? ?????? ???????.  ???? ?????? ??????? ?????? ??? ???? ???? ????? ????? ?? ???? ??? ??????? ???? ????? ????.  ????? ??????? ??? ??????? ???? ??????? ?????? ??????? ?? ????? ????? ????? ???? ?? ??? ???? ????. ?? ?????? ???????? ?? ???????????? ?? ???? ?? ?????? ?????????? ????????????? (NSAID) ??? ??????? ???? ???????.  ????? ????? ?????? ???????? ??? ??????? ???? ??????? ??????. ???? ??? ???. ??? ????? ?? ??? ????????? ?? ???? ?????? ????????? ???? ?????? ???? ??????? ??????. ???? ?? ?????? ??? ????? ???? ?? ???? ?? ???? ??????? ??????.? Document Revised: 08/10/2019 Document Reviewed: 08/10/2019 Elsevier Patient Education  2022 [employer].

## 2021-01-23 NOTE — Progress Notes (Signed)
April Bell, [place]  [id number] Phone:  [phone]   Fax:  [phone]   Established Patient Office Visit  Subjective:  Patient ID: April Bell, female    DOB: [birth year]  Age: 61 y.o. MRN: [id number]  CC:  Chief Complaint  Patient presents with   Follow-up    Pt is here today for her follow up visit. Pt states she will do her physical at her next visit. Pt states that she has been having some stomach problems and was on medications for that. Pt is having problems digesting her foods and she eats breakfast in the morning and can't eat again for the rest of the day because she feels full all day.    HPI April Bell presents for follow up. She  has a past medical history of Hyperlipidemia.   She has declined her physical today. She is having stomach issues. She was on omeprazole 20 mg which was effective however she stopped this. She is not taking any medications. Pt is having problems digesting her foods and she eats breakfast in the morning and can't eat again for the rest of the day because she feels full all day. She denies nausea, vomiting, constipation or diarrhea.   She was on pravastatin for her cholesterol. She denies any side effects.   She is requesting referral to opthalmology and dentist; orange card.    Past Medical History:  Diagnosis Date   Hyperlipidemia     No past surgical history on file.  Family History  Problem Relation Age of Onset   Hypertension Mother    Breast cancer Neg Hx     Social History   Socioeconomic History   Marital status: Widowed    Spouse name: Not on file   Number of children: Not on file   Years of education: Not on file   Highest education level: Not on file  Occupational History   Not on file  Tobacco Use   Smoking status: Former   Smokeless tobacco: Never  Vaping Use   Vaping Use: Some days   Substances: Flavoring  Substance and Sexual Activity    Alcohol use: No    Alcohol/week: 0.0 standard drinks   Drug use: No   Sexual activity: Yes    Birth control/protection: None  Other Topics Concern   Not on file  Social History Narrative   ** Merged History Encounter **       Social Determinants of Health   Financial Resource Strain: Not on file  Food Insecurity: Not on file  Transportation Needs: Not on file  Physical Activity: Not on file  Stress: Not on file  Social Connections: Not on file  Intimate Partner Violence: Not on file    Outpatient Medications Prior to Visit  Medication Sig Dispense Refill   amoxicillin (AMOXIL) 500 MG capsule Take 500 mg by mouth 3 (three) times daily. (Patient not taking: Reported on 12/24/2020)     diclofenac Sodium (VOLTAREN) 1 % GEL Apply 4 g topically 4 (four) times daily. To affected joint. (Patient not taking: Reported on 01/23/2021) 100 g 11   ibuprofen (ADVIL) 200 MG tablet Take 200 mg by mouth every 6 (six) hours as needed. (Patient not taking: Reported on 12/24/2020)     No facility-administered medications prior to visit.    No Known Allergies  ROS Review of Systems    Objective:    Physical Exam HENT:  Head: Normocephalic and atraumatic.  °Cardiovascular:  °   Rate and Rhythm: Normal rate and regular rhythm.  °   Pulses: Normal pulses.  °   Heart sounds: Normal heart sounds.  °Pulmonary:  °   Effort: Pulmonary effort is normal.  °   Breath sounds: Normal breath sounds.  °Musculoskeletal:     °   General: Normal range of motion.  °Skin: °   General: Skin is warm and dry.  °   Capillary Refill: Capillary refill takes less than 2 seconds.  °Neurological:  °   Mental Status: She is alert.  ° ° °BP 132/63    Pulse 68    Temp 98.2 °F (36.8 °C)    Ht 5' 5.75" (1.67 m)    Wt 171 lb 9.6 oz (77.8 kg)    BMI 27.91 kg/m²  °Wt Readings from Last 3 Encounters:  °01/23/21 171 lb 9.6 oz (77.8 kg)  °12/24/20 170 lb 6.4 oz (77.3 kg)  °12/10/19 166 lb 3.2 oz (75.4 kg)  ° ° ° °Health Maintenance  Due  °Topic Date Due  ° PAP SMEAR-Modifier  02/12/2019  ° COVID-19 Vaccine (3 - Booster for Pfizer series) 07/09/2019  ° ° °There are no preventive care reminders to display for this patient. ° °Lab Results  °Component Value Date  ° TSH 0.975 12/24/2020  ° °Lab Results  °Component Value Date  ° WBC 4.6 02/26/2019  ° HGB 14.4 02/26/2019  ° HCT 43.2 02/26/2019  ° MCV 88 02/26/2019  ° PLT 244 02/26/2019  ° °Lab Results  °Component Value Date  ° NA 142 12/24/2020  ° K 4.1 12/24/2020  ° CO2 22 09/19/2017  ° GLUCOSE 91 12/24/2020  ° BUN 11 12/24/2020  ° CREATININE 0.77 12/24/2020  ° BILITOT <0.2 12/24/2020  ° ALKPHOS 89 12/24/2020  ° AST 16 12/24/2020  ° ALT 15 09/19/2017  ° PROT 7.3 12/24/2020  ° ALBUMIN 4.3 12/24/2020  ° CALCIUM 9.5 12/24/2020  ° EGFR 89 12/24/2020  ° °Lab Results  °Component Value Date  ° CHOL 234 (H) 12/24/2020  ° °Lab Results  °Component Value Date  ° HDL 82 12/24/2020  ° °Lab Results  °Component Value Date  ° LDLCALC 134 (H) 12/24/2020  ° °Lab Results  °Component Value Date  ° TRIG 107 12/24/2020  ° °Lab Results  °Component Value Date  ° CHOLHDL 2.9 12/24/2020  ° °Lab Results  °Component Value Date  ° HGBA1C 5.6 12/24/2020  ° HGBA1C 5.6 12/24/2020  ° HGBA1C 5.6 (A) 12/24/2020  ° HGBA1C 5.6 12/24/2020  ° ° °  °Assessment & Plan:  ° °Problem List Items Addressed This Visit   ° °  ° Digestive  ° GERD (gastroesophageal reflux disease) °Restarted Omeprazole 40 mg  °Education was provided   ° Relevant Medications  ° omeprazole (PRILOSEC) 40 MG capsule  °  ° Other  ° Prediabetes - Primary °Stable  °Consider home glucose monitoring °Weight loss at least 5% of current body weight is can be achieved with lifestyle modification dietary changes and regular daily exercise °Encourage blood pressure control goal <120/80 and maintaining total cholesterol <200 °Follow-up every 3 to 6 months for reevaluation °Education material provided °  ° Hyperlipidemia  ° Relevant Medications  ° pravastatin (PRAVACHOL) 40 MG  tablet  ° °Other Visit Diagnoses   ° ° Needs assistance with dental care      ° Relevant Orders  ° Ambulatory referral to Dentistry  ° Wears glasses      °   Relevant Orders   Ambulatory referral to Ophthalmology   Breast screening       Relevant Orders   MM 3D SCREEN BREAST BILATERAL       Meds ordered this encounter  Medications   omeprazole (PRILOSEC) 40 MG capsule    Sig: Take 1 capsule (40 mg total) by mouth daily.    Dispense:  30 capsule    Refill:  3    Order Specific Question:   Supervising Provider    Answer:   Tresa Garter [[id number]]   pravastatin (PRAVACHOL) 40 MG tablet    Sig: Take 1 tablet (40 mg total) by mouth daily.    Dispense:  90 tablet    Refill:  1    Order Specific Question:   Supervising Provider    Answer:   Tresa Garter [id number]    Follow-up: Return in about 3 months (around 04/23/2021) for [place] [[id number]].    Vevelyn Francois, NP

## 2021-01-26 ENCOUNTER — Other Ambulatory Visit (HOSPITAL_BASED_OUTPATIENT_CLINIC_OR_DEPARTMENT_OTHER): Payer: Self-pay

## 2021-01-27 ENCOUNTER — Other Ambulatory Visit (HOSPITAL_BASED_OUTPATIENT_CLINIC_OR_DEPARTMENT_OTHER): Payer: Self-pay

## 2021-01-27 ENCOUNTER — Other Ambulatory Visit: Payer: Self-pay

## 2021-03-17 ENCOUNTER — Other Ambulatory Visit: Payer: Self-pay

## 2021-03-25 ENCOUNTER — Ambulatory Visit (HOSPITAL_BASED_OUTPATIENT_CLINIC_OR_DEPARTMENT_OTHER)
Admission: RE | Admit: 2021-03-25 | Discharge: 2021-03-25 | Disposition: A | Discharge status: Home / Self Care | Payer: No Typology Code available for payment source | Source: Ambulatory Visit | Attending: Nurse Practitioner | Admitting: Nurse Practitioner

## 2021-03-25 ENCOUNTER — Encounter: Payer: Self-pay | Admitting: Nurse Practitioner

## 2021-03-25 ENCOUNTER — Ambulatory Visit (INDEPENDENT_AMBULATORY_CARE_PROVIDER_SITE_OTHER): Payer: No Typology Code available for payment source | Admitting: Nurse Practitioner

## 2021-03-25 ENCOUNTER — Other Ambulatory Visit: Payer: Self-pay

## 2021-03-25 VITALS — BP 122/73 | HR 68 | Temp 98.3°F | Ht 65.75 in | Wt 169.2 lb

## 2021-03-25 DIAGNOSIS — R1033 Periumbilical pain: Secondary | ICD-10-CM | POA: Insufficient documentation

## 2021-03-25 DIAGNOSIS — G8929 Other chronic pain: Secondary | ICD-10-CM

## 2021-03-25 DIAGNOSIS — M25561 Pain in right knee: Secondary | ICD-10-CM

## 2021-03-25 DIAGNOSIS — M25562 Pain in left knee: Secondary | ICD-10-CM

## 2021-03-25 MED ORDER — METHYLPREDNISOLONE 4 MG PO TBPK: ORAL_TABLET | ORAL | 0 refills | Status: AC

## 2021-03-25 NOTE — Progress Notes (Signed)
? ?[facility] ?[place]  [address] ?Phone:  [phone]   Fax:  [phone] ? ? ?Established Patient Office Visit ? ?Subjective:  ?Patient ID: April Bell, female    DOB: [birth year]  Age: 61 y.o. MRN: [id number] ? ?CC:  ?Chief Complaint  ?Patient presents with  ? OFFICE VISIT  ?  Pt stated she has pain in her belly button for a month and knee pain in both knees for the past 2 years pt stated she took ibuprofen for the pain.  ? ? ?HPI ?April Bell presents for abdominal pain. She  has a past medical history of Hyperlipidemia.  ? ?Abdominal Pain ?Patient complains of naval pain. The pain is described as pulling type pain, and is 7/10 in intensity. The patient is experiencing naval pain without radiation. Onset was 1 month ago. Symptoms have been stable.  Aggravating factors: flatus.  Alleviating factors: She is using herbal and home remedy and medication form home "lipracs " .She does not have GERD but this abdominal  pain . She is taking the omeprazole without difficulty. ?  ?Knee Pain ?Patient presents with knee swelling involving both knees L>R Onset of the symptoms was several years ago. Inciting event: none known. Current symptoms include stiffness and swelling. Pain is aggravated by inactivity. Patient has had prior knee problems. 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 . Treatment to date: avoidance of offending activity and rest. Pain with inactivity 9-10. She does not feel the pain with walking. The pain is worse with sitting for long periods and getting up. The Voltaren gel is somewhat effective. She is taking vitamin B12 ? ?Past Medical History:  ?Diagnosis Date  ? Hyperlipidemia   ? ? ?History reviewed. No pertinent surgical history. ? ?Family History  ?Problem Relation Age of Onset  ? Hypertension Mother   ? Breast cancer Neg Hx   ? ? ?Social History  ? ?Socioeconomic History  ? Marital status: Widowed  ?   Spouse name: Not on file  ? Number of children: Not on file  ? Years of education: Not on file  ? Highest education level: Not on file  ?Occupational History  ? Not on file  ?Tobacco Use  ? Smoking status: Former  ? Smokeless tobacco: Never  ?Vaping Use  ? Vaping Use: Some days  ? Substances: Flavoring  ?Substance and Sexual Activity  ? Alcohol use: No  ?  Alcohol/week: 0.0 standard drinks  ? Drug use: No  ? Sexual activity: Yes  ?  Birth control/protection: None  ?Other Topics Concern  ? Not on file  ?Social History Narrative  ? ** Merged History Encounter **  ?    ? ?Social Determinants of Health  ? ?Financial Resource Strain: Not on file  ?Food Insecurity: Not on file  ?Transportation Needs: Not on file  ?Physical Activity: Not on file  ?Stress: Not on file  ?Social Connections: Not on file  ?Intimate Partner Violence: Not on file  ? ? ?Outpatient Medications Prior to Visit  ?Medication Sig Dispense Refill  ? omeprazole (PRILOSEC) 40 MG capsule Take 1 capsule (40 mg total) by mouth daily. 30 capsule 3  ? pravastatin (PRAVACHOL) 40 MG tablet Take 1 tablet (40 mg total) by mouth daily. 90 tablet 1  ? ?No facility-administered medications prior to visit.  ? ? ?No Known Allergies ? ?ROS ?Review of Systems ? ?  ?Objective:  ?  ?Physical Exam ?Constitutional:   ?  General: She is not in acute distress. ?HENT:  ?   Head: Normocephalic and atraumatic.  ?Cardiovascular:  ?   Rate and Rhythm: Normal rate.  ?   Pulses: Normal pulses.  ?Pulmonary:  ?   Effort: Pulmonary effort is normal.  ?Abdominal:  ?   General: Bowel sounds are normal.  ?   Palpations: Abdomen is soft.  ?   Tenderness: There is abdominal tenderness.  ?Musculoskeletal:  ?   Right lower leg: Swelling present.  ?   Left lower leg: Swelling, tenderness and bony tenderness present.  ?Skin: ?   General: Skin is warm and dry.  ?   Capillary Refill: Capillary refill takes less than 2 seconds.  ?Neurological:  ?   General: No focal deficit present.  ?   Mental  Status: She is alert and oriented to person, place, and time.  ? ? ?BP 122/73 (BP Location: Right Arm, Patient Position: Sitting, Cuff Size: Normal)   Pulse 68   Temp 98.3 ?F (36.8 ?C)   Ht 5' 5.75" (1.67 m)   Wt 169 lb 3.2 oz (76.7 kg)   SpO2 100%   BMI 27.52 kg/m?  ?Wt Readings from Last 3 Encounters:  ?03/25/21 169 lb 3.2 oz (76.7 kg)  ?01/23/21 171 lb 9.6 oz (77.8 kg)  ?12/24/20 170 lb 6.4 oz (77.3 kg)  ? ? ? ?There are no preventive care reminders to display for this patient. ? ? ?There are no preventive care reminders to display for this patient. ? ?Lab Results  ?Component Value Date  ? TSH 0.975 12/24/2020  ? ?Lab Results  ?Component Value Date  ? WBC 4.6 02/26/2019  ? HGB 14.4 02/26/2019  ? HCT 43.2 02/26/2019  ? MCV 88 02/26/2019  ? PLT 244 02/26/2019  ? ?Lab Results  ?Component Value Date  ? NA 142 12/24/2020  ? K 4.1 12/24/2020  ? CO2 22 09/19/2017  ? GLUCOSE 91 12/24/2020  ? BUN 11 12/24/2020  ? CREATININE 0.77 12/24/2020  ? BILITOT <0.2 12/24/2020  ? ALKPHOS 89 12/24/2020  ? AST 16 12/24/2020  ? ALT 15 09/19/2017  ? PROT 7.3 12/24/2020  ? ALBUMIN 4.3 12/24/2020  ? CALCIUM 9.5 12/24/2020  ? EGFR 89 12/24/2020  ? ?Lab Results  ?Component Value Date  ? CHOL 234 (H) 12/24/2020  ? ?Lab Results  ?Component Value Date  ? HDL 82 12/24/2020  ? ?Lab Results  ?Component Value Date  ? LDLCALC 134 (H) 12/24/2020  ? ?Lab Results  ?Component Value Date  ? TRIG 107 12/24/2020  ? ?Lab Results  ?Component Value Date  ? CHOLHDL 2.9 12/24/2020  ? ?Lab Results  ?Component Value Date  ? HGBA1C 5.6 12/24/2020  ? HGBA1C 5.6 12/24/2020  ? HGBA1C 5.6 (A) 12/24/2020  ? HGBA1C 5.6 12/24/2020  ? ? ?  ?Assessment & Plan:  ? ?Problem List Items Addressed This Visit   ?None ?Visit Diagnoses   ? ? Periumbilical abdominal pain    -  Primary  ? Relevant Orders  ? DG Abd 2 Views (Completed)  ? 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 Relevant Medications  ? methylPREDNISolone (MEDROL) 4 MG TBPK tablet   ? ?  ? ? ?Meds ordered this encounter  ?Medications  ? methylPREDNISolone (MEDROL) 4 MG TBPK tablet  ?  Sig: Follow package instructions.  ?  Dispense:  21 tablet  ?  Refill:  0  ?  Do not add to the "  Automatic Refill" notification system.  ?  Order Specific Question:   Supervising Provider  ?  AnswerTresa Garter [[id number]]  ? ? ?Follow-up: Return in about 3 months (around 06/25/2021) for abdomen xray nees assistance with where to go. .  ? ? ?Vevelyn Francois, NP ? ?

## 2021-03-25 NOTE — Patient Instructions (Addendum)
[facility] to schedule mammogram ?[phone]  ? ? ? ??????? ?????? ???? ?????? ??????? ?Exercises for Chronic Knee Pain ????? ?????? ??????? ?? ?? ??? ????? ????? ?? 3 ????. ????????? ???????? ?????? ????? ??? ??? ?? ???? ???? ???????? ????? ????? ?? ??????. ??? ???? ?????? ??????? ?????? ??? ??????? ??? ?? ???: ? ????? ??????? ???? ???? ?????. ???? ???? ?? ???? ????? ??? ????? ????? ?????. ? ??????? ?? ???? ??????. ? ?????? ??? ???? ???? ????? ?????? ?? ????? ??? ??????. ? ????? ????? (??? ??? ??????) ?????? ????? ?? ???????? ?????? ???????? ?????? ???????? ?????? ?????? ????????. ???????? ???????? ??????? ?????? ??? ??? ?????? ?????? ?????? ?? ????????? ??????? ???????. ????? ??? ?????? ????? ???? ????? ??????? ????? ?????? ?? ??????. ???? ??????? ??????? ?????? ??????? ?? ?? ????? ?????? ??????? ?? ??? ???? ?????? ?????? ??????? ???? ???? ????? ?? ?????. ?????? ??????? ?????? ? ????? ????? ??????? ??????? ??????? ???????? ??? ??????? ????? ????? ?? ??????: ? ???? ??? ?????? ???? ??????? ?????? ??? ???? ???????? ???????? ????????. ? ???? ???? ????? ????? ?? ???? ?? ??????? ?????. ? ?? ????? ???????? ???????? ??? ??? ??? ?????? ????. ? ????? ???? ??????? ?????. ???? ??? ????? ?????? ??? ??????? ?? ??????. ???? ??????? ?????? ?????? ??????? ?? ?????? ????? ??????? ???????? ???? 5-10 ????? ?? ???? ?????? ???????? ????????. ? ???? ???? ??? ???? ??????? ???? 5-10 ????? (??? ????? ?? ???? ????????) ??? ??? ?????? ???????. ? ???? ???????? ??????? ?????? ??????? ?? ??? ???? ???? ?? ????? ?????? ????????? ???????? ?? ?????. ?? ???? ????????? ?? ??? ??????? ??? ?????? ??? ?????. ???? ?? ??????? ?????? ??????? ?? ?????? ?? ??????? ?? ??? ???? ??? ?????? ???????? ????????. ???????? ?????? ???????? ????? ?????? ?????? ?????? ???? ????? ?? ?????? ? ?1. ?? ???????? ?????? ???? ???? ??? ???? ?? ???? ?? ????? ??? ??????. ?2. ????? ????? ?? ??? ?????? ???????? ?? ???? ??? ???? ????? ???? ????? ????? ???  ???????. ?3. ????? ??? ?????? ????? ????? ???? ??????. ?4. ???? ???? ????? ??? ?????? ??? ?? ???? ????? ?? ???? ?? ??????. ?5. ????? ??? ??? ????? ???? 30 ?????. ???? ??????? ___________ ???. ??? ??? ??????? ____________??? ??????. ?????? ?????? ???? ????? ?? ????? ? ?1. ???? ??? ????? ????? ???????? ?????? ???????? ?????. ?2. ?? ????? ???? ??? ????? ???????? ??? ????? ???? ????? ?? ??? ?????. ?3. ???? ?? ???? ?????? ????? ????? ???? ????? ?? ????? ?? ?????. ?4. ???? ??? ??? ????? ???? 30 ?????. ???? ??????? ___________ ???. ??? ??? ??????? ____________??? ??????. ?????? ?????? ???? ????? ?? ????? ? ?1. ?? ???? ????. ?2. ????? ?????? ???? ???????? ??? ??????? ??????? ????????? ?? ????? ?????? ?????? ??????. ?3. ???? ??? ???????? ??? ?????? ?? ??? ??? ????? ?? ??? ?????? ???? ????? ?????? ??? ??????. ?4. ?? ????? ???????? ???????? ?????? ?? ????? ????? ?? ????? ??????? ??? ??? ????? ?????? ?????? ??? ??????. ?5. ??? ?? ???? ????? ???? ?? ????? ?? ????? (???? ?? ??? ????? ?? ???????). ???? ??? ??? ????? ???? 30 ?????. ???? ??????? ___________ ???. ??? ??? ??????? ____________??? ??????. ??????? ??????? ???? ????? ??? ?????? ?1. ????? ??? ???? ?? ??? ???? ????? ???? ?????? ??? ?????????. ?2. ???? ????? ????????? ???? ??? ??? ??????. ?3. ????? ?? ??? ???? ??? ?? ???? ???? ??? ?????? 12 ???? (30 ??) ?? ?????. ?4. ????? ??? ??? ????? ???? 3-5 ????? ?? ???? ???? ????. ???? ??????? ___________ ???. ??? ??? ??????? ____________??? ??????. ???? ????? ????? ?? ???????? ??? ??? ????? ?1. ?? ??? ?????? ?????? ??? ????? ??? ??? ?? ????. ?  2. ???? ??? ????? ????? ?? ???????? ???? ???? ??? ??? ????? ?????? ???? ??? ?????. ?3. ??? ???? ????? ???? ??? ????? ???? ????? ?????? ???? ??? ??????? ???? ???? ????. ?4. ???? ??? ??? ????? ???? 3-5 ?????. ???? ??????? ___________ ???. ??? ??? ??????? ____________??? ??????. ???? ??? ?????? ?1. ?? ?? ??? ?????? ?????? ??? ???? ?? ?? ??????. ?2. ???? ???? ???? ????? ????????. ?3. ???? ??? ?????  ???????. ?? ??? ?????? ?????? ?? ??? ????? ??? ????? ??? ????? ?????? ?????? 90 ???? (????? ?????). ?4. ???? ??? ??? ????? ???? 3-5 ?????. ???? ??????? ___________ ???. ??? ??? ??????? ____________??? ??????. ??????? ???????? ?? ???????? ??? ?????? ?1. ?? ???????? ?? ??? ???? ?????? ????? ??? ????. ?2. ???? ???? ????? ?? ?? ??? ?? ????????? ?? ????? ???? ??? ??????. ?3. ??? ?? ???? ??????? ??? ????? ??????? ????? (61 ??)? ?? ??? ??? ?????. ?? ????????? ?? ????? ???? ?????? ????? ??? ??????? ???? ??? ?????? ????? ????? ??? ?? ??? ?????? ????? ?????? ????. ?4. ???? ??? ??? ????? ???? 5-10 ?????? ?? ???? ??? ?????? ???? ?????. ???? ??????? ___________ ???. ??? ??? ??????? ____________??? ??????. ???????? ?????? ?1. ????? ???? ????? ??? ??? ????? ??? ?? ???? ???? ??? ??????? 6 ????? (15 ??) ???????. ?2. ?? ??????? ?????? ?? ?????? ?? ??? ??? ????? ??? ????? ??????? ?? ?????? ??????? ??? ?????. ?3. ????? ????? ??? ??? ????? ???????? ??? ????? ??????? ????? ???? ?????? ?? ????? ??? ????? ?????. ?4. ???? ????? ?????? ????? ?? ??? ?? ??? ??????. ?5. ???? ??? ??? ????? ???? 3-5 ????? ?? ???? ????? ????. ???? ??????? ___________ ???. ??? ??? ??????? ____________??? ??????. ????? ?????? ??????? ?????? ?? ??????? ???????: ? ?????? ???? ??? ?????? ????????. ? ????? ????? ??? ?????? ????????. ? ??? ????? ??? ?????? ???????? ???? ?????. ???? ????? ?? ??? ????????? ?? ???? ?????? ????????? ???? ?????? ???? ??????? ??????. ???? ?? ?????? ??? ????? ???? ?? ???? ?? ???? ??????? ??????.? ?Document Revised: 01/24/2019 Document Reviewed: 01/24/2019 ?[employer] Patient Education ? 2022 [employer] ? ?Knee Exercises ?Ask your health care provider which exercises are safe for you. Do exercises exactly as told by your health care provider and adjust them as directed. It is normal to feel mild stretching, pulling, tightness, or discomfort as you do these exercises. Stop right away if you feel sudden pain or your pain gets worse. Do not begin  these exercises until told by your health care provider. ?Stretching and range-of-motion exercises ?These exercises warm up your muscles and joints and improve the movement and flexibility of your knee. These exercises also help to relieve pain and swelling. ?Knee extension, prone ? ?Lie on your abdomen (prone position) on a bed. ?Place your left / right knee just beyond the edge of the surface so your knee is not on the bed. You can put a towel under your left / right thigh just above your kneecap for comfort. ?Relax your leg muscles and allow gravity to straighten your knee (extension). You should feel a stretch behind your left / right knee. ?Hold this position for __________ seconds. ?Scoot up so your knee is supported between repetitions. ?Repeat __________ times. Complete this exercise __________ times a day. ?Knee flexion, active ? ?Lie on your back with both legs straight. If this causes back discomfort, bend your left / right knee so your foot is flat on the floor. ?Slowly slide your left / right heel back toward your buttocks. Stop when you feel a gentle stretch  in the front of your knee or thigh (flexion). ?Hold this position for __________ seconds. ?Slowly slide your left / right heel back to the starting position. ?Repeat __________ times. Complete this exercise __________ times a day. ?Quadriceps stretch, prone ? ?Lie on your abdomen on a firm surface, such as a bed or padded floor. ?Bend your left / right knee and hold your ankle. If you cannot reach your ankle or pant leg, loop a belt around your foot and grab the belt instead. ?Gently pull your heel toward your buttocks. Your knee should not slide out to the side. You should feel a stretch in the front of your thigh and knee (quadriceps). ?Hold this position for __________ seconds. ?Repeat __________ times. Complete this exercise __________ times a day. ?Hamstring, supine ? ?Lie on your back (supine position). ?Loop a belt or towel over the ball of  your left / right foot. The ball of your foot is on the walking surface, right under your toes. ?Straighten your left / right knee and slowly pull on the belt to raise your leg until you feel a gentle stretch b

## 2021-03-31 ENCOUNTER — Encounter: Payer: Self-pay | Admitting: Nurse Practitioner

## 2021-04-20 ENCOUNTER — Other Ambulatory Visit: Payer: Self-pay

## 2021-04-24 ENCOUNTER — Ambulatory Visit: Payer: No Typology Code available for payment source | Admitting: Nurse Practitioner

## 2021-06-26 ENCOUNTER — Ambulatory Visit: Payer: No Typology Code available for payment source | Admitting: Nurse Practitioner

## 2021-06-29 ENCOUNTER — Other Ambulatory Visit: Payer: Self-pay

## 2021-06-30 ENCOUNTER — Encounter: Payer: Self-pay | Admitting: Nurse Practitioner

## 2021-06-30 ENCOUNTER — Other Ambulatory Visit: Payer: Self-pay

## 2021-06-30 ENCOUNTER — Ambulatory Visit (INDEPENDENT_AMBULATORY_CARE_PROVIDER_SITE_OTHER): Payer: No Typology Code available for payment source | Admitting: Nurse Practitioner

## 2021-06-30 VITALS — BP 125/68 | HR 58 | Temp 97.3°F | Wt 166.6 lb

## 2021-06-30 DIAGNOSIS — M25462 Effusion, left knee: Secondary | ICD-10-CM

## 2021-06-30 DIAGNOSIS — K1370 Unspecified lesions of oral mucosa: Secondary | ICD-10-CM

## 2021-06-30 DIAGNOSIS — R7303 Prediabetes: Secondary | ICD-10-CM

## 2021-06-30 DIAGNOSIS — M25562 Pain in left knee: Secondary | ICD-10-CM

## 2021-06-30 MED ORDER — MELOXICAM 7.5 MG PO TABS: 7.5000 mg | ORAL_TABLET | Freq: Every day | ORAL | 0 refills | Status: DC

## 2021-06-30 NOTE — Progress Notes (Signed)
[place], [place]  [address] Phone:  [phone]   Fax:  [phone] Subjective:   Patient ID: April Bell, female    DOB: 21-Jul-1960, 61 y.o.   MRN: [id number]  Chief Complaint  Patient presents with   Follow-up   HPI April Bell 61 y.o. female  has a past medical history of Hyperlipidemia. To the [facility] for three month follow up and other concerns.   Patient concerned about 'hottness' at the corners of her mouth. Occurs intermittently. States that she has had symptoms x 1 yr. Pain typically resolves after applying bacitracin for 2-3 days.   Also concerned about swelling in the bilateral knees, the most pronounced in left knee. Pain increases at night. Was treated in the past with physical therapy and unknown medication, with only temporary relief in symptoms. Denies taking any medications for symptoms. Denies working at this time. Denies any other concerns today.   Denies any fatigue, chest pain, shortness of breath, HA or dizziness. Denies any blurred vision, numbness or tingling.  Past Medical History:  Diagnosis Date   Hyperlipidemia     No past surgical history on file.  Family History  Problem Relation Age of Onset   Hypertension Mother    Breast cancer Neg Hx     Social History   Socioeconomic History   Marital status: Widowed    Spouse name: Not on file   Number of children: Not on file   Years of education: Not on file   Highest education level: Not on file  Occupational History   Not on file  Tobacco Use   Smoking status: Former   Smokeless tobacco: Never  Vaping Use   Vaping Use: Some days   Substances: Flavoring  Substance and Sexual Activity   Alcohol use: No    Alcohol/week: 0.0 standard drinks of alcohol   Drug use: No   Sexual activity: Yes    Birth control/protection: None  Other Topics Concern   Not on file  Social History Narrative   ** Merged History Encounter **        Social Determinants of Health   Financial Resource Strain: Not on file  Food Insecurity: Not on file  Transportation Needs: Not on file  Physical Activity: Not on file  Stress: Not on file  Social Connections: Not on file  Intimate Partner Violence: Not on file    Outpatient Medications Prior to Visit  Medication Sig Dispense Refill   omeprazole (PRILOSEC) 40 MG capsule Take 1 capsule (40 mg total) by mouth daily. 30 capsule 3   pravastatin (PRAVACHOL) 40 MG tablet Take 1 tablet (40 mg total) by mouth daily. 90 tablet 1   No facility-administered medications prior to visit.    No Known Allergies  Review of Systems  Constitutional:  Negative for chills, fever and malaise/fatigue.  Respiratory:  Negative for cough and shortness of breath.   Cardiovascular:  Negative for chest pain, palpitations and leg swelling.  Gastrointestinal:  Negative for abdominal pain, blood in stool, constipation, diarrhea, nausea and vomiting.  Musculoskeletal:  Positive for joint pain. Negative for back pain, falls, myalgias and neck pain.  Skin:  Negative for itching and rash.       See HPI  Neurological: Negative.   Psychiatric/Behavioral:  Negative for depression. The patient is not nervous/anxious.   All other systems reviewed and are negative.      Objective:    Physical Exam Vitals  reviewed.  Constitutional:      General: She is not in acute distress.    Appearance: Normal appearance. She is normal weight.  HENT:     Head: Normocephalic.  Neck:     Vascular: No carotid bruit.  Cardiovascular:     Rate and Rhythm: Normal rate and regular rhythm.     Pulses: Normal pulses.     Heart sounds: Normal heart sounds.     Comments: No obvious peripheral edema Pulmonary:     Effort: Pulmonary effort is normal.     Breath sounds: Normal breath sounds.  Musculoskeletal:        General: No swelling, tenderness, deformity or signs of injury. Normal range of motion.     Cervical back:  Normal range of motion and neck supple. No rigidity or tenderness.     Right lower leg: No edema.     Left lower leg: No edema.  Lymphadenopathy:     Cervical: No cervical adenopathy.  Skin:    General: Skin is warm and dry.     Capillary Refill: Capillary refill takes less than 2 seconds.  Neurological:     General: No focal deficit present.     Mental Status: She is alert and oriented to person, place, and time.  Psychiatric:        Mood and Affect: Mood normal.        Behavior: Behavior normal.        Thought Content: Thought content normal.        Judgment: Judgment normal.     BP 125/68 (BP Location: Left Arm, Patient Position: Sitting, Cuff Size: Normal)   Pulse (!) 58   Temp (!) 97.3 F (36.3 C)   Wt 166 lb 9.6 oz (75.6 kg)   SpO2 100%   BMI 27.09 kg/m  Wt Readings from Last 3 Encounters:  06/30/21 166 lb 9.6 oz (75.6 kg)  03/25/21 169 lb 3.2 oz (76.7 kg)  01/23/21 171 lb 9.6 oz (77.8 kg)    Immunization History  Administered Date(s) Administered   Influenza,inj,Quad PF,6+ Mos 09/17/2014, 12/07/2016, 09/19/2017, 12/24/2020   PFIZER(Purple Top)SARS-COV-2 Vaccination 04/19/2019, 05/14/2019   Tdap 03/08/2016    Diabetic Foot Exam - Simple   No data filed     Lab Results  Component Value Date   TSH 0.975 12/24/2020   Lab Results  Component Value Date   WBC 4.6 02/26/2019   HGB 14.4 02/26/2019   HCT 43.2 02/26/2019   MCV 88 02/26/2019   PLT 244 02/26/2019   Lab Results  Component Value Date   NA 142 12/24/2020   K 4.1 12/24/2020   CO2 22 09/19/2017   GLUCOSE 91 12/24/2020   BUN 11 12/24/2020   CREATININE 0.77 12/24/2020   BILITOT <0.2 12/24/2020   ALKPHOS 89 12/24/2020   AST 16 12/24/2020   ALT 15 09/19/2017   PROT 7.3 12/24/2020   ALBUMIN 4.3 12/24/2020   CALCIUM 9.5 12/24/2020   EGFR 89 12/24/2020   Lab Results  Component Value Date   CHOL 234 (H) 12/24/2020   CHOL 237 (H) 07/25/2019   CHOL 250 (H) 02/26/2019   Lab Results   Component Value Date   HDL 82 12/24/2020   HDL 87 07/25/2019   HDL 93 02/26/2019   Lab Results  Component Value Date   LDLCALC 134 (H) 12/24/2020   LDLCALC 134 (H) 07/25/2019   LDLCALC 138 (H) 02/26/2019   Lab Results  Component Value Date   TRIG 107  12/24/2020   TRIG 96 07/25/2019   TRIG 111 02/26/2019   Lab Results  Component Value Date   CHOLHDL 2.9 12/24/2020   CHOLHDL 2.7 07/25/2019   CHOLHDL 2.7 02/26/2019   Lab Results  Component Value Date   HGBA1C 5.6 12/24/2020   HGBA1C 5.6 12/24/2020   HGBA1C 5.6 (A) 12/24/2020   HGBA1C 5.6 12/24/2020       Assessment & Plan:   Problem List Items Addressed This Visit       Other   Prediabetes - Primary Discussed diet and exercise at length  Encouraged continued diet and exercise efforts    Other Visit Diagnoses     Mouth lesion       Relevant Orders   Ambulatory referral to Dermatology Encouraged continued usage of bacitracin as needed  Discussed non pharmacological methods for management of symptoms Informed to take OTC medications as needed Encouraged to maintain follow up with specialist    Pain and swelling of knee, left       Relevant Medications   meloxicam (MOBIC) 7.5 MG tablet, initiated during visit   Other Relevant Orders   Ambulatory referral to Orthopedic Surgery Discussed non pharmacological methods for management of symptoms Informed to take OTC medications as needed    Follow up in 3 mths for reevaluation of mouth lesions and bilateral knee pain, sooner as needed     I am having April Bell start on meloxicam. I am also having her maintain her omeprazole and pravastatin.  Meds ordered this encounter  Medications   meloxicam (MOBIC) 7.5 MG tablet    Sig: Take 1 tablet (7.5 mg total) by mouth daily.    Dispense:  30 tablet    Refill:  0     Teena Dunk, NP

## 2021-07-03 ENCOUNTER — Ambulatory Visit (HOSPITAL_BASED_OUTPATIENT_CLINIC_OR_DEPARTMENT_OTHER): Payer: No Typology Code available for payment source | Admitting: Orthopaedic Surgery

## 2021-07-10 ENCOUNTER — Other Ambulatory Visit: Payer: Self-pay | Admitting: Nurse Practitioner

## 2021-07-10 DIAGNOSIS — M25462 Effusion, left knee: Secondary | ICD-10-CM

## 2021-09-28 ENCOUNTER — Ambulatory Visit: Payer: No Typology Code available for payment source | Admitting: Nurse Practitioner

## 2021-10-19 ENCOUNTER — Encounter: Payer: Self-pay | Admitting: Nurse Practitioner

## 2021-10-19 ENCOUNTER — Ambulatory Visit (INDEPENDENT_AMBULATORY_CARE_PROVIDER_SITE_OTHER): Payer: No Typology Code available for payment source | Admitting: Nurse Practitioner

## 2021-10-19 VITALS — BP 140/82 | HR 65 | Wt 165.4 lb

## 2021-10-19 DIAGNOSIS — R7303 Prediabetes: Secondary | ICD-10-CM

## 2021-10-19 DIAGNOSIS — M25562 Pain in left knee: Secondary | ICD-10-CM

## 2021-10-19 DIAGNOSIS — Z23 Encounter for immunization: Secondary | ICD-10-CM

## 2021-10-19 DIAGNOSIS — E782 Mixed hyperlipidemia: Secondary | ICD-10-CM

## 2021-10-19 DIAGNOSIS — M25462 Effusion, left knee: Secondary | ICD-10-CM

## 2021-10-19 MED ORDER — OMEPRAZOLE 40 MG PO CPDR
40.0000 mg | DELAYED_RELEASE_CAPSULE | Freq: Every day | ORAL | 3 refills | Status: DC
Start: 2021-10-19 — End: 2022-03-02

## 2021-10-19 MED ORDER — MELOXICAM 7.5 MG PO TABS: 7.5000 mg | ORAL_TABLET | Freq: Every day | ORAL | 0 refills | Status: DC

## 2021-10-19 MED ORDER — PRAVASTATIN SODIUM 40 MG PO TABS: 40.0000 mg | ORAL_TABLET | Freq: Every day | ORAL | 1 refills | Status: DC

## 2021-10-19 NOTE — Assessment & Plan Note (Signed)
-   Hemoglobin A1c - CBC - Comprehensive metabolic panel - Lipid Panel  2. Mixed hyperlipidemia  - Hemoglobin A1c - CBC - Comprehensive metabolic panel - Lipid Panel  3. Pain and swelling of knee, left  - meloxicam (MOBIC) 7.5 MG tablet; Take 1 tablet (7.5 mg total) by mouth daily.  Dispense: 30 tablet; Refill: 0   Follow up:  Follow up in 3 months or sooner if needed

## 2021-10-19 NOTE — Progress Notes (Signed)
@Patient  ID: April Bell, female    DOB: Aug 12, 1960, 61 y.o.   MRN: [id number]  Chief Complaint  Patient presents with   follow up    Patient is asking about the referral to eye doctor and was told surgery on eye. Hasnt heard anything since. She can no longer see out of her right eye. Wants flu shot.    Referring provider: [id number], NP   HPI  April Bell 61 y.o. female  has a past medical history of Hyperlipidemia. To the [facility] for three month follow up and other concerns.   Patient presents today for routine follow-up.  She states that she is doing well.  She is concerned about a referral that was placed by her eye doctor for surgery.  She does not know who her eye doctor is currently.  We will reach out to our referral specialist to see if we can figure this out for her.  Patient will need blood work today. Denies f/c/s, n/v/d, hemoptysis, PND, leg swelling Denies chest pain or edema     No Known Allergies  Immunization History  Administered Date(s) Administered   Influenza,inj,Quad PF,6+ Mos 09/17/2014, 12/07/2016, 09/19/2017, 12/24/2020, 10/19/2021   PFIZER(Purple Top)SARS-COV-2 Vaccination 04/19/2019, 05/14/2019   Tdap 03/08/2016    Past Medical History:  Diagnosis Date   Hyperlipidemia     Tobacco History: Social History   Tobacco Use  Smoking Status Former  Smokeless Tobacco Never   Counseling given: Not Answered   Outpatient Encounter Medications as of 10/19/2021  Medication Sig   [DISCONTINUED] meloxicam (MOBIC) 7.5 MG tablet Take 1 tablet (7.5 mg total) by mouth daily.   [DISCONTINUED] omeprazole (PRILOSEC) 40 MG capsule Take 1 capsule (40 mg total) by mouth daily.   [DISCONTINUED] pravastatin (PRAVACHOL) 40 MG tablet Take 1 tablet (40 mg total) by mouth daily.   meloxicam (MOBIC) 7.5 MG tablet Take 1 tablet (7.5 mg total) by mouth daily.   omeprazole (PRILOSEC) 40 MG capsule Take 1 capsule (40 mg total) by mouth daily.    pravastatin (PRAVACHOL) 40 MG tablet Take 1 tablet (40 mg total) by mouth daily.   No facility-administered encounter medications on file as of 10/19/2021.     Review of Systems  Review of Systems  Constitutional: Negative.   HENT: Negative.    Cardiovascular: Negative.   Gastrointestinal: Negative.   Allergic/Immunologic: Negative.   Neurological: Negative.   Psychiatric/Behavioral: Negative.         Physical Exam  BP (!) 140/82   Pulse 65   Wt 165 lb 6.4 oz (75 kg)   SpO2 100%   BMI 26.90 kg/m   Wt Readings from Last 5 Encounters:  10/19/21 165 lb 6.4 oz (75 kg)  06/30/21 166 lb 9.6 oz (75.6 kg)  03/25/21 169 lb 3.2 oz (76.7 kg)  01/23/21 171 lb 9.6 oz (77.8 kg)  12/24/20 170 lb 6.4 oz (77.3 kg)     Physical Exam Vitals and nursing note reviewed.  Constitutional:      General: She is not in acute distress.    Appearance: She is well-developed.  Cardiovascular:     Rate and Rhythm: Normal rate and regular rhythm.  Pulmonary:     Effort: Pulmonary effort is normal.     Breath sounds: Normal breath sounds.  Neurological:     Mental Status: She is alert and oriented to person, place, and time.      Lab Results:  CBC    Component Value Date/Time  WBC 4.6 02/26/2019 1137   WBC 4.8 08/18/2015 1017   RBC 4.93 02/26/2019 1137   RBC 4.79 08/18/2015 1017   HGB 14.4 02/26/2019 1137   HCT 43.2 02/26/2019 1137   PLT 244 02/26/2019 1137   MCV 88 02/26/2019 1137   MCH 29.2 02/26/2019 1137   MCH 28.6 08/18/2015 1017   MCHC 33.3 02/26/2019 1137   MCHC 33.4 08/18/2015 1017   RDW 12.9 02/26/2019 1137   LYMPHSABS 2.4 02/26/2019 1137   MONOABS 384 08/18/2015 1017   EOSABS 0.1 02/26/2019 1137   BASOSABS 0.1 02/26/2019 1137    BMET    Component Value Date/Time   NA 142 12/24/2020 1158   K 4.1 12/24/2020 1158   CL 103 12/24/2020 1158   CO2 22 09/19/2017 1149   GLUCOSE 91 12/24/2020 1158   GLUCOSE 102 (H) 08/18/2015 1017   BUN 11 12/24/2020 1158    CREATININE 0.77 12/24/2020 1158   CREATININE 0.75 08/18/2015 1017   CALCIUM 9.5 12/24/2020 1158   GFRNONAA 97 07/25/2019 1228   GFRNONAA >89 08/18/2015 1017   GFRAA 112 07/25/2019 1228   GFRAA >89 08/18/2015 1017      Assessment & Plan:   Prediabetes - Hemoglobin A1c - CBC - Comprehensive metabolic panel - Lipid Panel  2. Mixed hyperlipidemia  - Hemoglobin A1c - CBC - Comprehensive metabolic panel - Lipid Panel  3. Pain and swelling of knee, left  - meloxicam (MOBIC) 7.5 MG tablet; Take 1 tablet (7.5 mg total) by mouth daily.  Dispense: 30 tablet; Refill: 0   Follow up:  Follow up in 3 months or sooner if needed     Fenton Foy, NP 10/19/2021

## 2021-10-19 NOTE — Patient Instructions (Signed)
1. Prediabetes  - Hemoglobin A1c - CBC - Comprehensive metabolic panel - Lipid Panel  2. Mixed hyperlipidemia  - Hemoglobin A1c - CBC - Comprehensive metabolic panel - Lipid Panel  3. Pain and swelling of knee, left  - meloxicam (MOBIC) 7.5 MG tablet; Take 1 tablet (7.5 mg total) by mouth daily.  Dispense: 30 tablet; Refill: 0   Follow up:  Follow up in 3 months or sooner if needed

## 2021-10-20 LAB — LIPID PANEL
Chol/HDL Ratio: 2.7 ratio (ref 0.0–4.4)
Cholesterol, Total: 250 mg/dL — ABNORMAL HIGH (ref 100–199)
HDL: 94 mg/dL (ref >39)
LDL Chol Calc (NIH): 141 mg/dL — ABNORMAL HIGH (ref 0–99)
Triglycerides: 92 mg/dL (ref 0–149)
VLDL Cholesterol Cal: 15 mg/dL (ref 5–40)

## 2021-10-20 LAB — COMPREHENSIVE METABOLIC PANEL
ALT: 13 IU/L (ref 0–32)
AST: 16 IU/L (ref 0–40)
Albumin/Globulin Ratio: 1.4 (ref 1.2–2.2)
Albumin: 4.2 g/dL (ref 3.8–4.9)
Alkaline Phosphatase: 95 IU/L (ref 44–121)
BUN/Creatinine Ratio: 13 (ref 12–28)
BUN: 10 mg/dL (ref 8–27)
Bilirubin Total: 0.2 mg/dL (ref 0.0–1.2)
CO2: 27 mmol/L (ref 20–29)
Calcium: 9.7 mg/dL (ref 8.7–10.3)
Chloride: 102 mmol/L (ref 96–106)
Creatinine, Ser: 0.77 mg/dL (ref 0.57–1.00)
Globulin, Total: 3.1 g/dL (ref 1.5–4.5)
Glucose: 96 mg/dL (ref 70–99)
Potassium: 4.4 mmol/L (ref 3.5–5.2)
Sodium: 140 mmol/L (ref 134–144)
Total Protein: 7.3 g/dL (ref 6.0–8.5)
eGFR: 88 mL/min/{1.73_m2} (ref >59)

## 2021-10-20 LAB — CBC
Hematocrit: 43.6 % (ref 34.0–46.6)
Hemoglobin: 14.1 g/dL (ref 11.1–15.9)
MCH: 28.8 pg (ref 26.6–33.0)
MCHC: 32.3 g/dL (ref 31.5–35.7)
MCV: 89 fL (ref 79–97)
Platelets: 251 10*3/uL (ref 150–450)
RBC: 4.9 x10E6/uL (ref 3.77–5.28)
RDW: 12.7 % (ref 11.7–15.4)
WBC: 5.7 10*3/uL (ref 3.4–10.8)

## 2021-10-20 LAB — HEMOGLOBIN A1C
Est. average glucose Bld gHb Est-mCnc: 123 mg/dL
Hgb A1c MFr Bld: 5.9 % — ABNORMAL HIGH (ref 4.8–5.6)

## 2021-11-23 ENCOUNTER — Telehealth: Payer: Self-pay | Admitting: Nurse Practitioner

## 2021-11-23 NOTE — Telephone Encounter (Signed)
Thank you :)

## 2021-11-23 NOTE — Telephone Encounter (Signed)
[facility] reached out requesting a referral for eye surgery be made for the pt. The pt was seen at [facility] prior (referral made via [facility]).   I contacted [facility] for their records for the pt and the procedure being requested. Pt's records were faxed to [place] from [facility] 11/23/21.  I sent pt's  referral (demographics, recent PCP note, and records from [facility]) to [facility] in [place] 11/23/21.   Pt's daughter has been notified and given the information for [facility]. 11/23/21

## 2021-11-30 ENCOUNTER — Ambulatory Visit: Payer: No Typology Code available for payment source

## 2021-11-30 ENCOUNTER — Other Ambulatory Visit: Payer: Self-pay | Admitting: Nurse Practitioner

## 2021-11-30 DIAGNOSIS — N6310 Unspecified lump in the right breast, unspecified quadrant: Secondary | ICD-10-CM

## 2021-12-14 ENCOUNTER — Other Ambulatory Visit: Payer: Self-pay

## 2021-12-14 ENCOUNTER — Ambulatory Visit: Admission: RE | Admit: 2021-12-14 | Payer: No Typology Code available for payment source | Source: Ambulatory Visit

## 2021-12-14 ENCOUNTER — Ambulatory Visit: Payer: No Typology Code available for payment source

## 2021-12-14 DIAGNOSIS — Z87898 Personal history of other specified conditions: Secondary | ICD-10-CM

## 2022-01-05 ENCOUNTER — Telehealth: Payer: Self-pay | Admitting: Nurse Practitioner

## 2022-01-05 NOTE — Telephone Encounter (Signed)
Pt daughter called about EYE referral and never receive a call back

## 2022-01-28 ENCOUNTER — Ambulatory Visit: Payer: Self-pay | Admitting: Hematology and Oncology

## 2022-01-28 ENCOUNTER — Ambulatory Visit
Admission: RE | Admit: 2022-01-28 | Discharge: 2022-01-28 | Disposition: A | Discharge status: Home / Self Care | Payer: No Typology Code available for payment source | Source: Ambulatory Visit | Attending: Obstetrics and Gynecology | Admitting: Obstetrics and Gynecology

## 2022-01-28 VITALS — BP 140/88 | Wt 162.2 lb

## 2022-01-28 DIAGNOSIS — Z87898 Personal history of other specified conditions: Secondary | ICD-10-CM

## 2022-01-28 DIAGNOSIS — N6310 Unspecified lump in the right breast, unspecified quadrant: Secondary | ICD-10-CM

## 2022-01-28 DIAGNOSIS — Z1211 Encounter for screening for malignant neoplasm of colon: Secondary | ICD-10-CM

## 2022-01-28 NOTE — Progress Notes (Signed)
Ms. April Bell is a 62 y.o. female who presents to [facility] clinic today with no complaints . Callback from 2019 for possible right breast mass; [place]   Pap Smear: Pap not smear completed today. Last Pap smear was 2018 and was normal. Per patient has no history of an abnormal Pap smear. Last Pap smear result is available in Epic. Patient refuses Pap smear today. She was informed of the free Pap screening monthly. She will call for an appointment when she is ready.    Physical exam: Breasts Breasts symmetrical. No skin abnormalities bilateral breasts. No nipple retraction bilateral breasts. No nipple discharge bilateral breasts. No lymphadenopathy. No lumps palpated bilateral breasts.     MM DIAG BREAST TOMO BILATERAL  Result Date: 06/02/2017 CLINICAL DATA:  Follow-up for probably benign RIGHT breast mass. Diagnostic report of 02/12/2016 described a probably benign mass within the RIGHT breast at the 10:30 o'clock axis, suspected complicated cyst or benign intramammary lymph node, for which a six-month follow-up was recommended to ensure stability. Patient returns today for the follow-up diagnostic exam. EXAM: DIGITAL DIAGNOSTIC BILATERAL MAMMOGRAM WITH CAD AND TOMO ULTRASOUND RIGHT BREAST COMPARISON:  Previous exams including diagnostic mammogram and ultrasound dated 12/11/2016. ACR Breast Density Category b: There are scattered areas of fibroglandular density. FINDINGS: The oval circumscribed low-density mass within the outer RIGHT breast is not significantly changed in the interval stable in size as measured on the MLO projection. There are no new dominant masses, suspicious calcifications or secondary signs of malignancy identified within either breast. Mammographic images were processed with CAD. Targeted ultrasound is performed, again showing an oval circumscribed hypoechoic mass in the RIGHT breast at the 10:30 o'clock axis, 6 cm from the  nipple, measuring 5 x 2 x 5 mm, avascular, stable compared to the previous study. IMPRESSION: 1. Stable probably benign mass within the RIGHT breast at the 10:30 o'clock axis, 6 cm from the nipple, measuring 5 mm, presumed complicated cyst or benign intramammary lymph node. Recommend additional follow-up diagnostic mammogram in 12 months to ensure 2 year stability. 2.  No evidence of malignancy within the LEFT breast. RECOMMENDATION: Bilateral diagnostic mammogram, and possible RIGHT breast ultrasound, in 12 months. I have discussed the findings and recommendations with the patient and her daughter. Results were also provided in writing at the conclusion of the visit. If applicable, a reminder letter will be sent to the patient regarding the next appointment. BI-RADS CATEGORY  3: Probably benign. Electronically Signed   By: Franki Cabot M.D.   On: 06/02/2017 14:05      Pelvic/Bimanual Pap is not indicated today    Smoking History: Patient has is a current smoker at 1 packs per day and was referred to quit line.    Patient Navigation: Patient education provided. Access to services provided for patient through [place] interpreter provided. No transportation provided   Colorectal Cancer Screening: Per patient has never had colonoscopy completed No complaints today. FIT test given.   Breast and Cervical Cancer Risk Assessment: Patient does not have family history of breast cancer, known genetic mutations, or radiation treatment to the chest before age 20. Patient does not have history of cervical dysplasia, immunocompromised, or DES exposure in-utero.  Risk Assessment   No risk assessment data for the current encounter  Risk Scores       06/02/2017   Last edited by: Armond Hang, LPN   5-year risk:  1 %   Lifetime risk: 6.6 %            A: [facility] exam without pap smear No complaints with benign exam. Call back for possible right breast mass.  P: Referred  patient to the [facility] for a diagnostic mammogram. Appointment scheduled 01/28/22.  Melodye Ped, NP 01/28/2022 9:55 AM

## 2022-01-28 NOTE — Patient Instructions (Signed)
Taught [place] about breast self awareness and gave educational materials to take home. Patient did need a Pap smear today due to last Pap smear was in 2018 per patient; however, patient refuses. Told patient about free cervical cancer screenings to receive a Pap smear if would like one next year. Let her know [facility] will cover Pap smears every 5 years unless has a history of abnormal Pap smears. Referred patient to the [facility] for diagnostic mammogram. Appointment scheduled for 01/28/2022. Patient aware of appointment and will be there. Let patient know will follow up with her within the next couple weeks with results. [place] verbalized understanding.  Melodye Ped, NP  10:01 AM

## 2022-02-28 ENCOUNTER — Encounter (HOSPITAL_BASED_OUTPATIENT_CLINIC_OR_DEPARTMENT_OTHER): Payer: Self-pay | Admitting: Emergency Medicine

## 2022-02-28 ENCOUNTER — Other Ambulatory Visit: Payer: Self-pay

## 2022-02-28 ENCOUNTER — Emergency Department (HOSPITAL_BASED_OUTPATIENT_CLINIC_OR_DEPARTMENT_OTHER): Payer: No Typology Code available for payment source

## 2022-02-28 ENCOUNTER — Emergency Department (HOSPITAL_BASED_OUTPATIENT_CLINIC_OR_DEPARTMENT_OTHER)
Admission: EM | Admit: 2022-02-28 | Discharge: 2022-02-28 | Disposition: A | Discharge status: Home / Self Care | Payer: No Typology Code available for payment source | Attending: Emergency Medicine | Admitting: Emergency Medicine

## 2022-02-28 DIAGNOSIS — N3000 Acute cystitis without hematuria: Secondary | ICD-10-CM | POA: Insufficient documentation

## 2022-02-28 DIAGNOSIS — Z1152 Encounter for screening for COVID-19: Secondary | ICD-10-CM | POA: Insufficient documentation

## 2022-02-28 LAB — CBC WITH DIFFERENTIAL/PLATELET
Abs Immature Granulocytes: 0.01 10*3/uL (ref 0.00–0.07)
Basophils Absolute: 0.1 10*3/uL (ref 0.0–0.1)
Basophils Relative: 1 %
Eosinophils Absolute: 0.1 10*3/uL (ref 0.0–0.5)
Eosinophils Relative: 2 %
HCT: 40.5 % (ref 36.0–46.0)
Hemoglobin: 13.6 g/dL (ref 12.0–15.0)
Immature Granulocytes: 0 %
Lymphocytes Relative: 49 %
Lymphs Abs: 2.3 10*3/uL (ref 0.7–4.0)
MCH: 29 pg (ref 26.0–34.0)
MCHC: 33.6 g/dL (ref 30.0–36.0)
MCV: 86.4 fL (ref 80.0–100.0)
Monocytes Absolute: 0.4 10*3/uL (ref 0.1–1.0)
Monocytes Relative: 8 %
Neutro Abs: 1.9 10*3/uL (ref 1.7–7.7)
Neutrophils Relative %: 40 %
Platelets: 230 10*3/uL (ref 150–400)
RBC: 4.69 MIL/uL (ref 3.87–5.11)
RDW: 12.6 % (ref 11.5–15.5)
WBC: 4.8 10*3/uL (ref 4.0–10.5)
nRBC: 0 % (ref 0.0–0.2)

## 2022-02-28 LAB — COMPREHENSIVE METABOLIC PANEL
ALT: 13 U/L (ref 0–44)
AST: 19 U/L (ref 15–41)
Albumin: 3.3 g/dL — ABNORMAL LOW (ref 3.5–5.0)
Alkaline Phosphatase: 60 U/L (ref 38–126)
Anion gap: 5 (ref 5–15)
BUN: 11 mg/dL (ref 6–20)
CO2: 25 mmol/L (ref 22–32)
Calcium: 8.6 mg/dL — ABNORMAL LOW (ref 8.9–10.3)
Chloride: 106 mmol/L (ref 98–111)
Creatinine, Ser: 0.71 mg/dL (ref 0.44–1.00)
GFR, Estimated: >60 mL/min (ref >60)
Glucose, Bld: 108 mg/dL — ABNORMAL HIGH (ref 70–99)
Potassium: 3.7 mmol/L (ref 3.5–5.1)
Sodium: 136 mmol/L (ref 135–145)
Total Bilirubin: 0.4 mg/dL (ref 0.3–1.2)
Total Protein: 7 g/dL (ref 6.5–8.1)

## 2022-02-28 LAB — URINALYSIS, ROUTINE W REFLEX MICROSCOPIC
Bilirubin Urine: NEGATIVE
Glucose, UA: NEGATIVE mg/dL
Hgb urine dipstick: NEGATIVE
Ketones, ur: NEGATIVE mg/dL
Nitrite: NEGATIVE
Protein, ur: 30 mg/dL — AB
Specific Gravity, Urine: 1.02 (ref 1.005–1.030)
pH: 7 (ref 5.0–8.0)

## 2022-02-28 LAB — RESP PANEL BY RT-PCR (RSV, FLU A&B, COVID)  RVPGX2
Influenza A by PCR: NEGATIVE
Influenza B by PCR: NEGATIVE
Resp Syncytial Virus by PCR: NEGATIVE
SARS Coronavirus 2 by RT PCR: NEGATIVE

## 2022-02-28 LAB — URINALYSIS, MICROSCOPIC (REFLEX)

## 2022-02-28 LAB — GROUP A STREP BY PCR: Group A Strep by PCR: NOT DETECTED

## 2022-02-28 MED ORDER — SODIUM CHLORIDE 0.9 % IV BOLUS
1000.0000 mL | Freq: Once | INTRAVENOUS | Status: AC
Start: 2022-02-28 — End: 2022-02-28
  Administered 2022-02-28: 1000 mL via INTRAVENOUS

## 2022-02-28 MED ORDER — CEPHALEXIN 500 MG PO CAPS: 500.0000 mg | ORAL_CAPSULE | Freq: Two times a day (BID) | ORAL | 0 refills | Status: AC

## 2022-02-28 NOTE — ED Provider Notes (Signed)
[place] EMERGENCY DEPARTMENT AT [place] [place] [facility] Provider Note   CSN: [id number] Arrival date & time: 02/28/22  1120     History  Chief Complaint  Patient presents with   Cough    April Bell is a 62 y.o. female.  Patient here with cough and fever for the last little bit.  Fever now mostly resolved.  Has a history of high cholesterol.  Still with cough.  Denies any chest pain or shortness of breath or weakness or numbness or chills.  Has had bodyaches and headaches intermittently.  Decreased energy.  Denies any nausea vomiting diarrhea.  The history is provided by the patient.       Home Medications Prior to Admission medications   Medication Sig Start Date End Date Taking? Authorizing Provider  cephALEXin (KEFLEX) 500 MG capsule Take 1 capsule (500 mg total) by mouth 2 (two) times daily for 5 days. 02/28/22 03/05/22 Yes Shaniqwa Horsman, DO  meloxicam (MOBIC) 7.5 MG tablet Take 1 tablet (7.5 mg total) by mouth daily. Patient not taking: Reported on 01/28/2022 10/19/21   Fenton Foy, NP  omeprazole (PRILOSEC) 40 MG capsule Take 1 capsule (40 mg total) by mouth daily. 10/19/21 02/16/22  Fenton Foy, NP  pravastatin (PRAVACHOL) 40 MG tablet Take 1 tablet (40 mg total) by mouth daily. 10/19/21 02/16/22  Fenton Foy, NP      Allergies    Patient has no known allergies.    Review of Systems   Review of Systems  Physical Exam Updated Vital Signs BP 136/83 (BP Location: Left Arm)   Pulse 73   Temp 98.2 F (36.8 C) (Oral)   Resp (!) 22   Ht '5\' 8"'$  (1.727 m)   Wt 74.8 kg   SpO2 100%   BMI 25.09 kg/m  Physical Exam Vitals and nursing note reviewed.  Constitutional:      General: She is not in acute distress.    Appearance: She is well-developed. She is not ill-appearing.  HENT:     Head: Normocephalic and atraumatic.     Nose: Nose normal.     Mouth/Throat:     Mouth: Mucous membranes are moist.  Eyes:     Extraocular Movements:  Extraocular movements intact.     Conjunctiva/sclera: Conjunctivae normal.     Pupils: Pupils are equal, round, and reactive to light.  Cardiovascular:     Rate and Rhythm: Normal rate and regular rhythm.     Pulses: Normal pulses.     Heart sounds: Normal heart sounds. No murmur heard. Pulmonary:     Effort: Pulmonary effort is normal. No respiratory distress.     Breath sounds: Normal breath sounds.  Abdominal:     Palpations: Abdomen is soft.     Tenderness: There is no abdominal tenderness.  Musculoskeletal:        General: No swelling.     Cervical back: Normal range of motion and neck supple.  Skin:    General: Skin is warm and dry.     Capillary Refill: Capillary refill takes less than 2 seconds.  Neurological:     General: No focal deficit present.     Mental Status: She is alert and oriented to person, place, and time.     Cranial Nerves: No cranial nerve deficit.     Sensory: No sensory deficit.     Motor: No weakness.     Coordination: Coordination normal.  Psychiatric:  Mood and Affect: Mood normal.     ED Results / Procedures / Treatments   Labs (all labs ordered are listed, but only abnormal results are displayed) Labs Reviewed  COMPREHENSIVE METABOLIC PANEL - Abnormal; Notable for the following components:      Result Value   Glucose, Bld 108 (*)    Calcium 8.6 (*)    Albumin 3.3 (*)    All other components within normal limits  URINALYSIS, ROUTINE W REFLEX MICROSCOPIC - Abnormal; Notable for the following components:   APPearance CLOUDY (*)    Protein, ur 30 (*)    Leukocytes,Ua MODERATE (*)    All other components within normal limits  URINALYSIS, MICROSCOPIC (REFLEX) - Abnormal; Notable for the following components:   Bacteria, UA MANY (*)    All other components within normal limits  RESP PANEL BY RT-PCR (RSV, FLU A&B, COVID)  [id number]  GROUP A STREP BY PCR  URINE CULTURE  CBC WITH DIFFERENTIAL/PLATELET    EKG None  Radiology DG Chest  2 View  Result Date: 02/28/2022 CLINICAL DATA:  Shortness of breath, cough, fever, headache and fatigue. EXAM: CHEST - 2 VIEW COMPARISON:  None Available. FINDINGS: Heart size and mediastinal contours are within normal limits. Lungs are clear. No pleural effusion or pneumothorax is seen. Osseous structures about the chest are unremarkable. IMPRESSION: No active cardiopulmonary disease. No evidence of pneumonia or pulmonary edema. Electronically Signed   By: Franki Cabot M.D.   On: 02/28/2022 11:55    Procedures Procedures    Medications Ordered in ED Medications  sodium chloride 0.9 % bolus 1,000 mL (1,000 mLs Intravenous New Bag/Given 02/28/22 1253)    ED Course/ Medical Decision Making/ A&P                             Medical Decision Making Amount and/or Complexity of Data Reviewed Labs: ordered.   [name] is here with generalized weakness, cough.  Viral type symptoms for the last week or so.  Fever now resolved.  Normal vitals.  No fever.  Well-appearing.  Denies any chest pain or shortness of breath or abdominal pain.  No nausea or vomiting.  Will check for COVID and flu check basic labs to look for electrolyte abnormality, dehydration.  Chest x-ray already done per my review interpretation shows no evidence of pneumonia or pneumothorax.  Overall per my review and interpretation of labs does appear to be may be urinary tract infection.  Will treat with antibiotics.  COVID and flu test were negative.  No significant anemia or electrolyte abnormality or kidney injury otherwise.  Patient follow-up with primary care doctor.  Discharged in good condition.  This chart was dictated using voice recognition software.  Despite best efforts to proofread,  errors can occur which can change the documentation meaning.         Final Clinical Impression(s) / ED Diagnoses Final diagnoses:  Acute cystitis without hematuria    Rx / DC Orders ED Discharge Orders           Ordered    cephALEXin (KEFLEX) 500 MG capsule  2 times daily        02/28/22 [place], DO 02/28/22 1337

## 2022-02-28 NOTE — ED Triage Notes (Signed)
Pt c/o cough, fever, headache and fatigue x 2 weeks.

## 2022-03-01 LAB — URINE CULTURE

## 2022-03-02 ENCOUNTER — Other Ambulatory Visit: Payer: Self-pay

## 2022-03-02 MED ORDER — OMEPRAZOLE 40 MG PO CPDR: 40.0000 mg | DELAYED_RELEASE_CAPSULE | Freq: Every day | ORAL | 3 refills | Status: DC

## 2022-03-02 NOTE — Telephone Encounter (Signed)
From: [place] To: Office of Fenton Foy, NP Sent: 03/02/2022 7:55 AM EST Subject: Medication Renewal Request  Refills have been requested for the following medications:   omeprazole (PRILOSEC) 40 MG capsule Kriste Basque Nichols]  Preferred pharmacy: [facility], [place] - [address] AT [place] Delivery method: [employer]

## 2022-03-05 ENCOUNTER — Ambulatory Visit: Payer: No Typology Code available for payment source

## 2022-03-05 ENCOUNTER — Other Ambulatory Visit: Payer: Self-pay

## 2022-03-05 DIAGNOSIS — Z419 Encounter for procedure for purposes other than remedying health state, unspecified: Secondary | ICD-10-CM | POA: Diagnosis not present

## 2022-03-08 ENCOUNTER — Ambulatory Visit: Payer: Medicaid Other

## 2022-03-09 ENCOUNTER — Ambulatory Visit: Payer: No Typology Code available for payment source

## 2022-03-25 ENCOUNTER — Ambulatory Visit: Payer: Medicaid Other | Admitting: Dermatology

## 2022-04-05 DIAGNOSIS — Z419 Encounter for procedure for purposes other than remedying health state, unspecified: Secondary | ICD-10-CM | POA: Diagnosis not present

## 2022-04-19 ENCOUNTER — Ambulatory Visit: Payer: Medicaid Other | Admitting: Nurse Practitioner

## 2022-04-26 DIAGNOSIS — H2512 Age-related nuclear cataract, left eye: Secondary | ICD-10-CM | POA: Diagnosis not present

## 2022-04-26 DIAGNOSIS — H268 Other specified cataract: Secondary | ICD-10-CM | POA: Diagnosis not present

## 2022-04-29 ENCOUNTER — Ambulatory Visit (INDEPENDENT_AMBULATORY_CARE_PROVIDER_SITE_OTHER): Payer: Medicaid Other | Admitting: Nurse Practitioner

## 2022-04-29 VITALS — BP 111/59 | HR 62 | Temp 97.0°F | Wt 165.2 lb

## 2022-04-29 DIAGNOSIS — Z1211 Encounter for screening for malignant neoplasm of colon: Secondary | ICD-10-CM | POA: Diagnosis not present

## 2022-04-29 DIAGNOSIS — G8929 Other chronic pain: Secondary | ICD-10-CM | POA: Insufficient documentation

## 2022-04-29 DIAGNOSIS — R7303 Prediabetes: Secondary | ICD-10-CM

## 2022-04-29 DIAGNOSIS — K5900 Constipation, unspecified: Secondary | ICD-10-CM

## 2022-04-29 DIAGNOSIS — Z13220 Encounter for screening for lipoid disorders: Secondary | ICD-10-CM | POA: Diagnosis not present

## 2022-04-29 DIAGNOSIS — M25562 Pain in left knee: Secondary | ICD-10-CM

## 2022-04-29 LAB — POCT GLYCOSYLATED HEMOGLOBIN (HGB A1C): Hemoglobin A1C: 5.8 % — AB (ref 4.0–5.6)

## 2022-04-29 MED ORDER — PRAVASTATIN SODIUM 40 MG PO TABS: 40.0000 mg | ORAL_TABLET | Freq: Every day | ORAL | 1 refills | Status: DC

## 2022-04-29 MED ORDER — DOCUSATE SODIUM 100 MG PO CAPS: 100.0000 mg | ORAL_CAPSULE | Freq: Two times a day (BID) | ORAL | 0 refills | Status: AC

## 2022-04-29 NOTE — Patient Instructions (Signed)
1. Chronic pain of left knee  - DG Knee Complete 4 Views Left - Ambulatory referral to Orthopedics  2. Prediabetes  - POCT glycosylated hemoglobin (Hb A1C) - CBC - Comprehensive metabolic panel  3. Colon cancer screening  - Cologuard  4. Lipid screening  - pravastatin (PRAVACHOL) 40 MG tablet; Take 1 tablet (40 mg total) by mouth daily.  Dispense: 90 tablet; Refill: 1  5. Constipation, unspecified constipation type  - docusate sodium (COLACE) 100 MG capsule; Take 1 capsule (100 mg total) by mouth 2 (two) times daily.  Dispense: 10 capsule; Refill: 0  Follow up:  Follow up in 6 months

## 2022-04-29 NOTE — Assessment & Plan Note (Signed)
-   DG Knee Complete 4 Views Left - Ambulatory referral to Orthopedics  2. Prediabetes  - POCT glycosylated hemoglobin (Hb A1C) - CBC - Comprehensive metabolic panel  3. Colon cancer screening  - Cologuard  4. Lipid screening  - pravastatin (PRAVACHOL) 40 MG tablet; Take 1 tablet (40 mg total) by mouth daily.  Dispense: 90 tablet; Refill: 1  5. Constipation, unspecified constipation type  - docusate sodium (COLACE) 100 MG capsule; Take 1 capsule (100 mg total) by mouth 2 (two) times daily.  Dispense: 10 capsule; Refill: 0  Follow up:  Follow up in 6 months

## 2022-04-29 NOTE — Progress Notes (Signed)
  ID: April Bell, female    DOB: 12/03/1960, 62 y.o.   MRN: [id number]  Chief Complaint  Patient presents with   Follow-up    Blood sugar   Constipation   Knee Pain    Started two three years ago    Referring provider: No ref. provider found   HPI  Patient presents today for follow-up visit.  She states that she has been having issues with constipation and chronic knee pain.  Patient does have a history of prediabetes.  Will check A1c and labs today.  Patient is due for lipid screening today.   Constipation:  Constipation has been present for the past few weeks.  Notices blood at times when straining to use the bathroom.  Patient states that she does have to strain when she tries to use the bathroom.  We will trial Colace.  Left knee pain:  Patient states that pain has been present for over 3 years. Has seen ortho in the past and did have joint injection and fluid removed from knee.  She states that the pain did improve after that but has recently returned.  We will get an updated x-ray and refer patient back to orthopedics for further treatment.  Denies f/c/s, n/v/d, hemoptysis, PND, leg swelling Denies chest pain or edema      No Known Allergies  Immunization History  Administered Date(s) Administered   Influenza,inj,Quad PF,6+ Mos 09/17/2014, 12/07/2016, 09/19/2017, 12/24/2020, 10/19/2021   PFIZER(Purple Top)SARS-COV-2 Vaccination 04/19/2019, 05/14/2019   Tdap 03/08/2016    Past Medical History:  Diagnosis Date   Hyperlipidemia     Tobacco History: Social History   Tobacco Use  Smoking Status Some Days   Types: Cigarettes  Smokeless Tobacco Never   Ready to quit: Not Answered Counseling given: Not Answered   Outpatient Encounter Medications as of 04/29/2022  Medication Sig   omeprazole (PRILOSEC) 40 MG capsule Take 1 capsule (40 mg total) by mouth daily.   pravastatin (PRAVACHOL) 40 MG tablet Take 1 tablet (40 mg total) by mouth  daily.   meloxicam (MOBIC) 7.5 MG tablet Take 1 tablet (7.5 mg total) by mouth daily. (Patient not taking: Reported on 01/28/2022)   No facility-administered encounter medications on file as of 04/29/2022.     Review of Systems  Review of Systems  Constitutional: Negative.   HENT: Negative.    Cardiovascular: Negative.   Gastrointestinal: Negative.   Allergic/Immunologic: Negative.   Neurological: Negative.   Psychiatric/Behavioral: Negative.         Physical Exam  BP (!) 111/59   Pulse 62   Temp (!) 97 F (36.1 C)   Wt 165 lb 3.2 oz (74.9 kg)   SpO2 100%   BMI 25.12 kg/m   Wt Readings from Last 5 Encounters:  04/29/22 165 lb 3.2 oz (74.9 kg)  02/28/22 165 lb (74.8 kg)  01/28/22 162 lb 3.2 oz (73.6 kg)  10/19/21 165 lb 6.4 oz (75 kg)  06/30/21 166 lb 9.6 oz (75.6 kg)     Physical Exam Vitals and nursing note reviewed.  Constitutional:      General: She is not in acute distress.    Appearance: She is well-developed.  Cardiovascular:     Rate and Rhythm: Normal rate and regular rhythm.  Pulmonary:     Effort: Pulmonary effort is normal.     Breath sounds: Normal breath sounds.  Neurological:     Mental Status: She is alert and oriented to person, place, and time.  Lab Results:  CBC    Component Value Date/Time   WBC 4.8 02/28/2022 1252   RBC 4.69 02/28/2022 1252   HGB 13.6 02/28/2022 1252   HGB 14.1 10/19/2021 1038   HCT 40.5 02/28/2022 1252   HCT 43.6 10/19/2021 1038   PLT 230 02/28/2022 1252   PLT 251 10/19/2021 1038   MCV 86.4 02/28/2022 1252   MCV 89 10/19/2021 1038   MCH 29.0 02/28/2022 1252   MCHC 33.6 02/28/2022 1252   RDW 12.6 02/28/2022 1252   RDW 12.7 10/19/2021 1038   LYMPHSABS 2.3 02/28/2022 1252   LYMPHSABS 2.4 02/26/2019 1137   MONOABS 0.4 02/28/2022 1252   EOSABS 0.1 02/28/2022 1252   EOSABS 0.1 02/26/2019 1137   BASOSABS 0.1 02/28/2022 1252   BASOSABS 0.1 02/26/2019 1137    BMET    Component Value Date/Time   NA 136  02/28/2022 1252   NA 140 10/19/2021 1038   K 3.7 02/28/2022 1252   CL 106 02/28/2022 1252   CO2 25 02/28/2022 1252   GLUCOSE 108 (H) 02/28/2022 1252   BUN 11 02/28/2022 1252   BUN 10 10/19/2021 1038   CREATININE 0.71 02/28/2022 1252   CREATININE 0.75 08/18/2015 1017   CALCIUM 8.6 (L) 02/28/2022 1252   GFRNONAA >60 02/28/2022 1252   GFRNONAA >89 08/18/2015 1017   GFRAA 112 07/25/2019 1228   GFRAA >89 08/18/2015 1017    BNP No results found for: "BNP"  ProBNP No results found for: "PROBNP"  Imaging: No results found.   Assessment & Plan:   No problem-specific Assessment & Plan notes found for this encounter.     Ivonne Andrew, NP 04/29/2022

## 2022-04-30 LAB — COMPREHENSIVE METABOLIC PANEL
ALT: 13 IU/L (ref 0–32)
AST: 13 IU/L (ref 0–40)
Albumin/Globulin Ratio: 1.3 (ref 1.2–2.2)
Albumin: 3.9 g/dL (ref 3.9–4.9)
Alkaline Phosphatase: 82 IU/L (ref 44–121)
BUN/Creatinine Ratio: 24 (ref 12–28)
BUN: 17 mg/dL (ref 8–27)
Bilirubin Total: <0.2 mg/dL (ref 0.0–1.2)
CO2: 22 mmol/L (ref 20–29)
Calcium: 9.2 mg/dL (ref 8.7–10.3)
Chloride: 105 mmol/L (ref 96–106)
Creatinine, Ser: 0.71 mg/dL (ref 0.57–1.00)
Globulin, Total: 2.9 g/dL (ref 1.5–4.5)
Glucose: 105 mg/dL — ABNORMAL HIGH (ref 70–99)
Potassium: 4.3 mmol/L (ref 3.5–5.2)
Sodium: 140 mmol/L (ref 134–144)
Total Protein: 6.8 g/dL (ref 6.0–8.5)
eGFR: 97 mL/min/{1.73_m2} (ref >59)

## 2022-04-30 LAB — CBC
Hematocrit: 41.6 % (ref 34.0–46.6)
Hemoglobin: 13.8 g/dL (ref 11.1–15.9)
MCH: 29.3 pg (ref 26.6–33.0)
MCHC: 33.2 g/dL (ref 31.5–35.7)
MCV: 88 fL (ref 79–97)
Platelets: 250 10*3/uL (ref 150–450)
RBC: 4.71 x10E6/uL (ref 3.77–5.28)
RDW: 12.7 % (ref 11.7–15.4)
WBC: 5.4 10*3/uL (ref 3.4–10.8)

## 2022-05-04 ENCOUNTER — Ambulatory Visit: Payer: Medicaid Other | Admitting: Physician Assistant

## 2022-05-05 ENCOUNTER — Encounter: Payer: Self-pay | Admitting: Orthopaedic Surgery

## 2022-05-05 ENCOUNTER — Other Ambulatory Visit (INDEPENDENT_AMBULATORY_CARE_PROVIDER_SITE_OTHER): Payer: Medicaid Other

## 2022-05-05 ENCOUNTER — Ambulatory Visit: Payer: Medicaid Other | Admitting: Orthopaedic Surgery

## 2022-05-05 VITALS — Ht 68.0 in | Wt 165.0 lb

## 2022-05-05 DIAGNOSIS — M1712 Unilateral primary osteoarthritis, left knee: Secondary | ICD-10-CM

## 2022-05-05 DIAGNOSIS — Z419 Encounter for procedure for purposes other than remedying health state, unspecified: Secondary | ICD-10-CM | POA: Diagnosis not present

## 2022-05-05 MED ORDER — METHYLPREDNISOLONE ACETATE 40 MG/ML IJ SUSP
40.0000 mg | INTRAMUSCULAR | Status: AC | PRN
Start: 2022-05-05 — End: 2022-05-05
  Administered 2022-05-05: 40 mg via INTRA_ARTICULAR

## 2022-05-05 MED ORDER — LIDOCAINE HCL 1 % IJ SOLN
2.0000 mL | INTRAMUSCULAR | Status: AC | PRN
Start: 2022-05-05 — End: 2022-05-05
  Administered 2022-05-05: 2 mL

## 2022-05-05 MED ORDER — BUPIVACAINE HCL 0.5 % IJ SOLN
2.0000 mL | INTRAMUSCULAR | Status: AC | PRN
Start: 2022-05-05 — End: 2022-05-05
  Administered 2022-05-05: 2 mL via INTRA_ARTICULAR

## 2022-05-05 NOTE — Progress Notes (Signed)
Office Visit Note   Patient: April Bell           Date of Birth: 12/13/1960           MRN: [id number] Visit Date: 05/05/2022              Requested by: Ivonne Andrew, NP [phone] [address] [place],  [place] [address] PCP: Ivonne Andrew, NP   Assessment & Plan: Visit Diagnoses:  1. Primary osteoarthritis of left knee     Plan: Impression is left knee arthritis.  Today, we discussed various treatment options to include prescription NSAIDs versus cortisone injection.  She is elected to proceed with cortisone injection today.  She will follow-up with [place] as needed.  Follow-Up Instructions: Return if symptoms worsen or fail to improve.   Orders:  Orders Placed This Encounter  Procedures   Large Joint Inj   XR KNEE 3 VIEW LEFT   No orders of the defined types were placed in this encounter.     Procedures: Large Joint Inj: L knee on 05/05/2022 7:03 PM Details: 22 G needle Medications: 2 mL bupivacaine 0.5 %; 2 mL lidocaine 1 %; 40 mg methylPREDNISolone acetate 40 MG/ML Outcome: tolerated well, no immediate complications Patient was prepped and draped in the usual sterile fashion.       Clinical Data: No additional findings.   Subjective: Chief Complaint  Patient presents with   Left Knee - Pain    HPI patient is a pleasant 62 year old female here today with an Arabic interpreter.  She is here with left knee pain for the past 3 years.  The pain she has is primarily to the anterior medial aspect.  Symptoms are intermittent but worse with descending stairs.  She denies any locking or catching but does note crepitus.  She has been taking NSAIDs as well as topical creams without significant relief.  She does note a history of a Baker's cyst aspiration 3 years ago which she says was painful.  No cortisone injection to the knee joint that she is aware of.  Review of Systems as detailed in HPI.  All others reviewed and are negative.   Objective: Vital  Signs: Ht 5\' 8"  (1.727 m)   Wt 165 lb (74.8 kg)   BMI 25.09 kg/m   Physical Exam well-developed well-nourished female no acute distress.  Alert and oriented x 3.  Ortho Exam left knee exam shows no effusion.  Range of motion 0 to 125 degrees.  Marked patellofemoral crepitus.  Moderate medial joint line tenderness.  Ligaments are stable.  She is neurovascular intact distally.  Specialty Comments:  No specialty comments available.  Imaging: XR KNEE 3 VIEW LEFT  Result Date: 05/05/2022 Decreased joint space medial and patellofemoral compartments    PMFS History: Patient Active Problem List   Diagnosis Date Noted   Chronic pain of left knee 04/29/2022   Hyperlipidemia 03/08/2016   Prediabetes 09/19/2014   GERD (gastroesophageal reflux disease) 09/19/2014   Urine leukocytes increased 09/19/2014   Past Medical History:  Diagnosis Date   Hyperlipidemia     Family History  Problem Relation Age of Onset   Hypertension Mother    Breast cancer Neg Hx     No past surgical history on file. Social History   Occupational History   Not on file  Tobacco Use   Smoking status: Some Days    Types: Cigarettes   Smokeless tobacco: Never  Vaping Use   Vaping  Use: Former   Substances: Flavoring  Substance and Sexual Activity   Alcohol use: No    Alcohol/week: 0.0 standard drinks of alcohol   Drug use: No   Sexual activity: Not Currently    Birth control/protection: None

## 2022-05-14 DIAGNOSIS — H268 Other specified cataract: Secondary | ICD-10-CM | POA: Diagnosis not present

## 2022-06-05 DIAGNOSIS — Z419 Encounter for procedure for purposes other than remedying health state, unspecified: Secondary | ICD-10-CM | POA: Diagnosis not present

## 2022-06-10 DIAGNOSIS — H268 Other specified cataract: Secondary | ICD-10-CM | POA: Diagnosis not present

## 2022-06-11 DIAGNOSIS — Z961 Presence of intraocular lens: Secondary | ICD-10-CM | POA: Diagnosis not present

## 2022-06-11 DIAGNOSIS — Z9841 Cataract extraction status, right eye: Secondary | ICD-10-CM | POA: Diagnosis not present

## 2022-06-11 DIAGNOSIS — H2512 Age-related nuclear cataract, left eye: Secondary | ICD-10-CM | POA: Diagnosis not present

## 2022-06-17 DIAGNOSIS — Z48810 Encounter for surgical aftercare following surgery on the sense organs: Secondary | ICD-10-CM | POA: Diagnosis not present

## 2022-06-17 DIAGNOSIS — Z961 Presence of intraocular lens: Secondary | ICD-10-CM | POA: Diagnosis not present

## 2022-06-17 DIAGNOSIS — Z9841 Cataract extraction status, right eye: Secondary | ICD-10-CM | POA: Diagnosis not present

## 2022-06-17 DIAGNOSIS — H269 Unspecified cataract: Secondary | ICD-10-CM | POA: Diagnosis not present

## 2022-06-23 ENCOUNTER — Other Ambulatory Visit: Payer: Self-pay | Admitting: Nurse Practitioner

## 2022-07-05 DIAGNOSIS — Z419 Encounter for procedure for purposes other than remedying health state, unspecified: Secondary | ICD-10-CM | POA: Diagnosis not present

## 2022-07-28 IMAGING — CR DG ABDOMEN 2V
2 series · 2 of 2 positions shown · non-contrast
Comparison: None.

CLINICAL DATA: Twenty days of periumbilical abdominal pain.

EXAM:
ABDOMEN - 2 VIEW

[w abdomen upright]
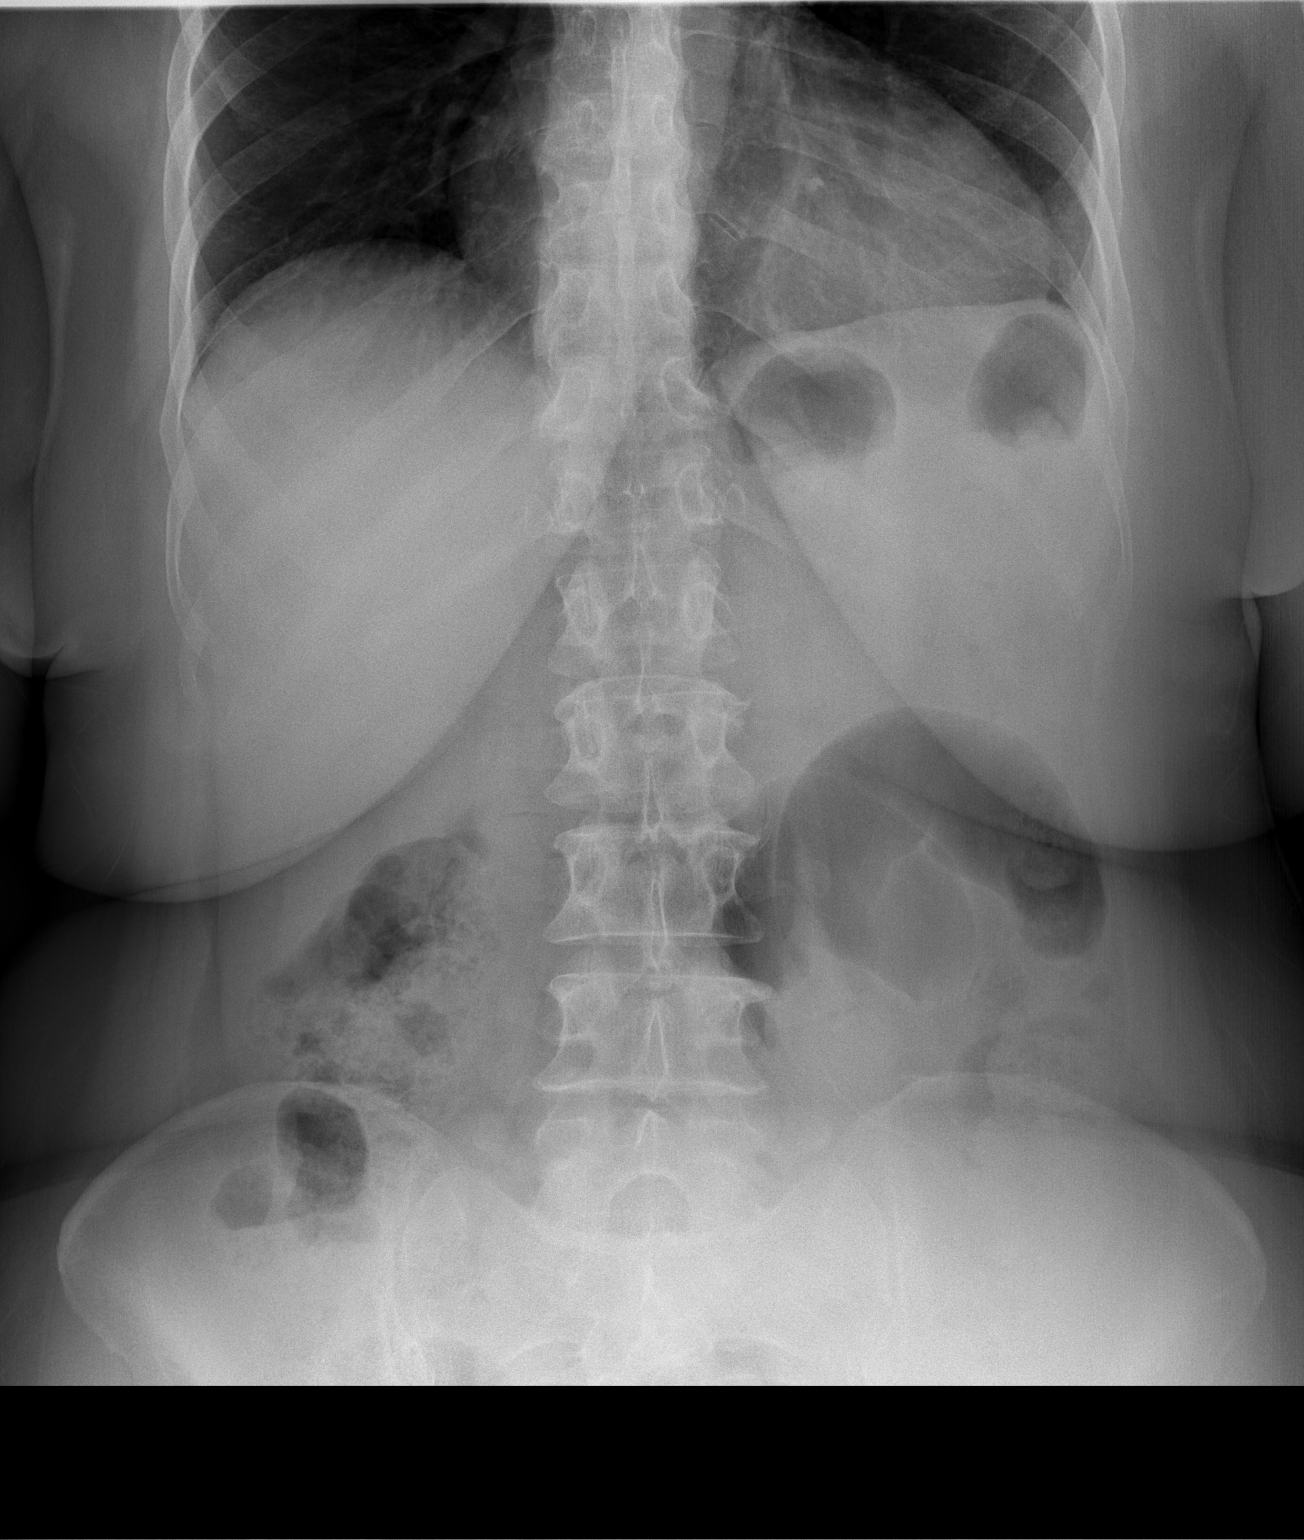

[t abdomen supine]
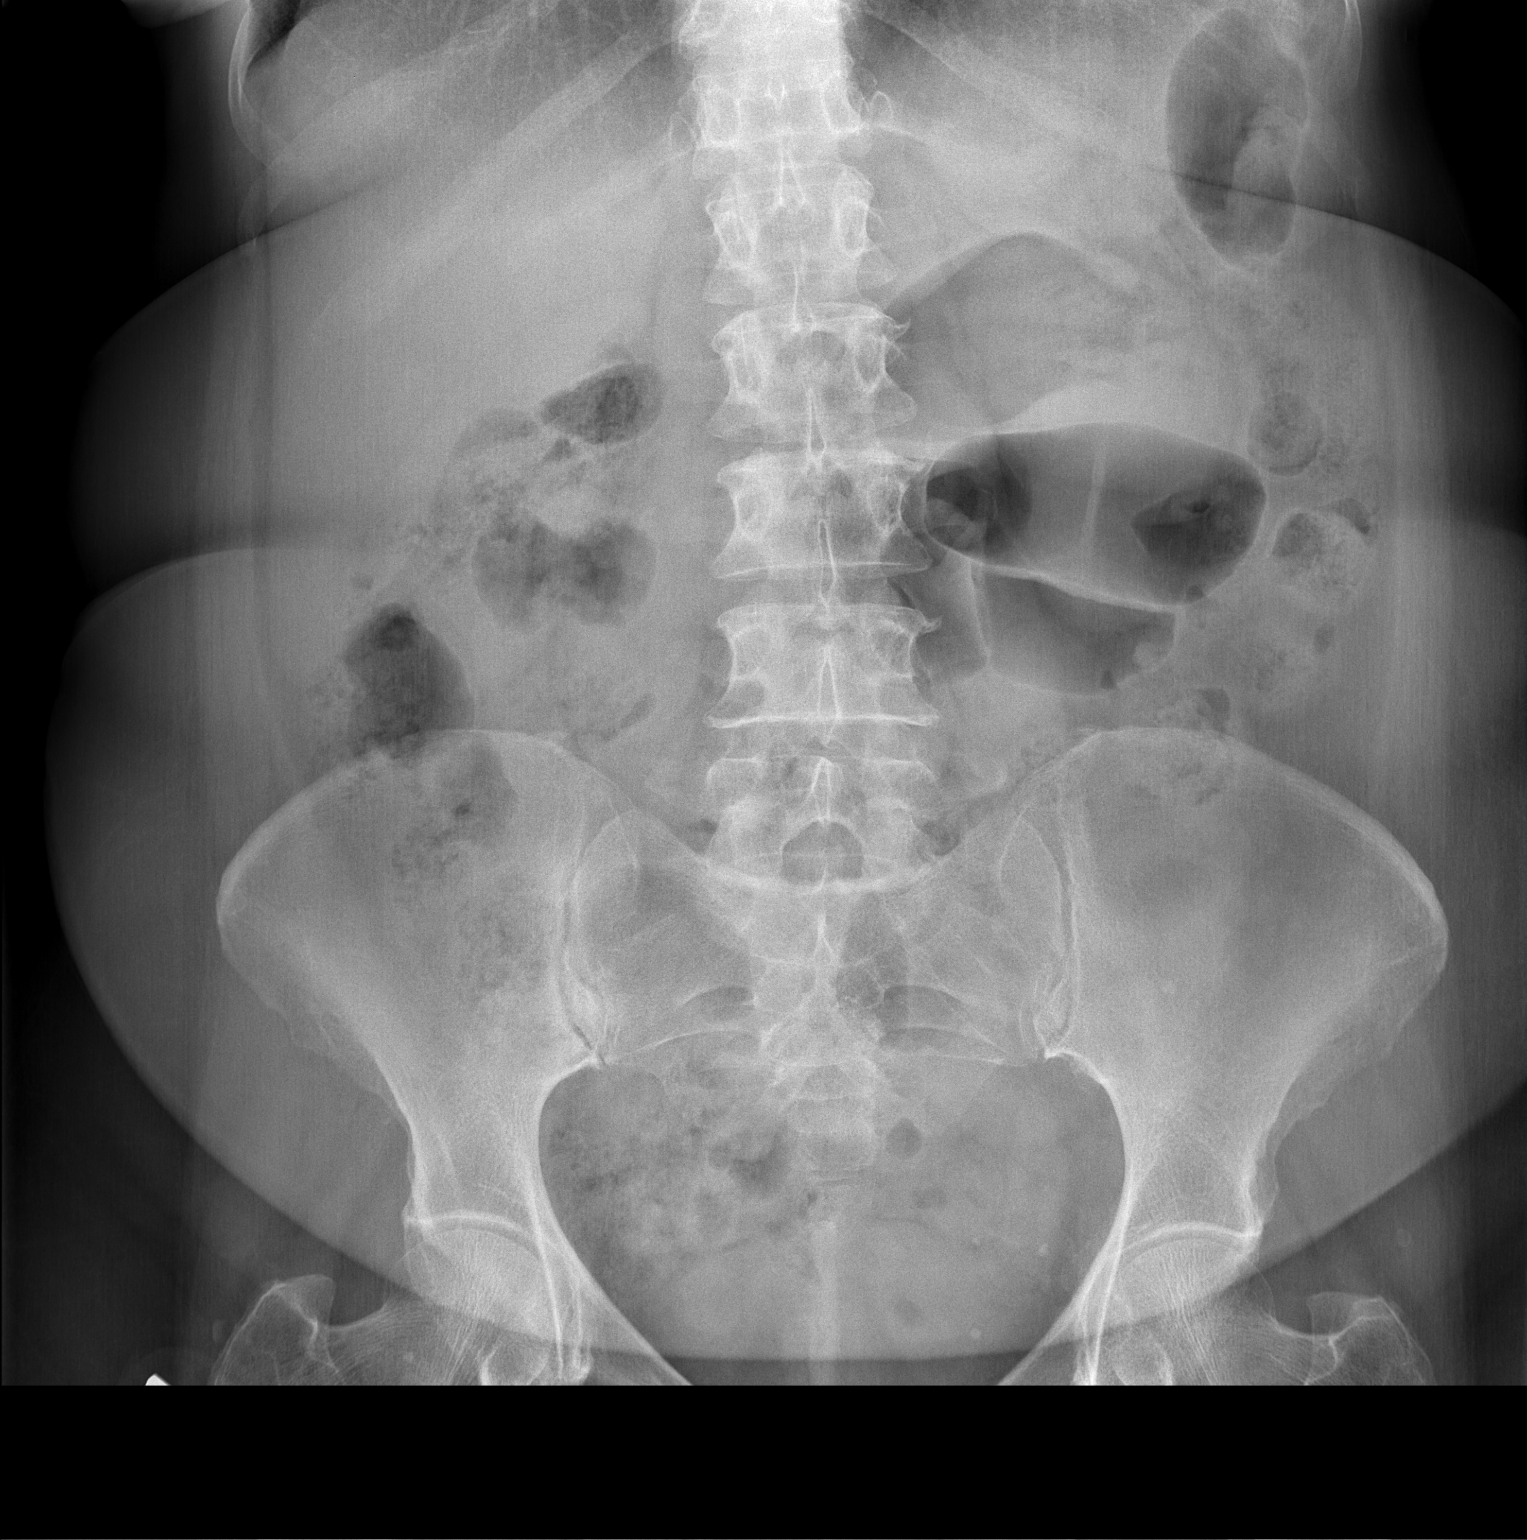

[2 of 2 positions shown; findings below may reference images not displayed]

FINDINGS: Stool throughout the colon. Nonspecific focally dilated loop of
small bowel within the left hemiabdomen measuring up to 4.4 cm. Lung
bases are clear. Osseous structures unremarkable.
IMPRESSION: Nonspecific focally dilated loop of small bowel within the left
hemiabdomen. Early or partial obstruction not excluded.

Stool throughout the colon as can be seen with constipation.

## 2022-07-30 DIAGNOSIS — Z961 Presence of intraocular lens: Secondary | ICD-10-CM | POA: Diagnosis not present

## 2022-07-30 DIAGNOSIS — H2512 Age-related nuclear cataract, left eye: Secondary | ICD-10-CM | POA: Diagnosis not present

## 2022-07-30 DIAGNOSIS — Z9841 Cataract extraction status, right eye: Secondary | ICD-10-CM | POA: Diagnosis not present

## 2022-08-05 DIAGNOSIS — Z419 Encounter for procedure for purposes other than remedying health state, unspecified: Secondary | ICD-10-CM | POA: Diagnosis not present

## 2022-09-05 DIAGNOSIS — Z419 Encounter for procedure for purposes other than remedying health state, unspecified: Secondary | ICD-10-CM | POA: Diagnosis not present

## 2022-10-05 DIAGNOSIS — Z419 Encounter for procedure for purposes other than remedying health state, unspecified: Secondary | ICD-10-CM | POA: Diagnosis not present

## 2022-10-06 ENCOUNTER — Encounter: Payer: Self-pay | Admitting: Nurse Practitioner

## 2022-10-06 ENCOUNTER — Ambulatory Visit (INDEPENDENT_AMBULATORY_CARE_PROVIDER_SITE_OTHER): Payer: Self-pay | Admitting: Nurse Practitioner

## 2022-10-06 VITALS — BP 139/58 | HR 67 | Temp 97.3°F | Wt 164.8 lb

## 2022-10-06 DIAGNOSIS — Z23 Encounter for immunization: Secondary | ICD-10-CM | POA: Diagnosis not present

## 2022-10-06 DIAGNOSIS — R112 Nausea with vomiting, unspecified: Secondary | ICD-10-CM

## 2022-10-06 DIAGNOSIS — E559 Vitamin D deficiency, unspecified: Secondary | ICD-10-CM

## 2022-10-06 DIAGNOSIS — E538 Deficiency of other specified B group vitamins: Secondary | ICD-10-CM

## 2022-10-06 DIAGNOSIS — R630 Anorexia: Secondary | ICD-10-CM | POA: Diagnosis not present

## 2022-10-06 DIAGNOSIS — Z1329 Encounter for screening for other suspected endocrine disorder: Secondary | ICD-10-CM

## 2022-10-06 DIAGNOSIS — R7303 Prediabetes: Secondary | ICD-10-CM

## 2022-10-06 LAB — POCT GLYCOSYLATED HEMOGLOBIN (HGB A1C): Hemoglobin A1C: 5.9 % — AB (ref 4.0–5.6)

## 2022-10-06 NOTE — Patient Instructions (Signed)
1. Need for influenza vaccination  - Flu vaccine trivalent PF, 6mos and older(Flulaval,Afluria,Fluarix,Fluzone)  2. Thyroid disorder screen  - TSH  3. Vitamin B12 deficiency  - Vitamin B12  4. Vitamin D deficiency  - Vitamin D, 25-hydroxy  5. Nausea and vomiting, unspecified vomiting type  - CBC - Comprehensive metabolic panel  6. Prediabetes  - POCT glycosylated hemoglobin (Hb A1C)  Follow up:  Follow up in 1 month

## 2022-10-06 NOTE — Progress Notes (Signed)
Subjective   Patient ID: April Bell, female    DOB: 06-22-1960, 62 y.o.   MRN: [id number]  Chief Complaint  Patient presents with   Follow-up    Pre diabetes     Fatigue    On and off but the past three days have been bad    Referring provider: Ivonne Andrew, NP  April Bell is a 62 y.o. female with Past Medical History: No date: Hyperlipidemia   HPI  Patient presents today complaining of fatigue.  She states that this has been on and off for a while but she has been very fatigued over the past few days.  She states that she has had decreased appetite and has not been staying well-hydrated.  We discussed that she can trial 6 snacks throughout the day and make sure that she is staying hydrated.  We will check labs today.  Patient states that for several years she has had an issue with vomiting when she eats a full meal.  She does take omeprazole.  She states that her bowel movements have been regular and her last bowel movement was yesterday.  We will place a referral to GI for further evaluation. Denies f/c/s, n/v/d, hemoptysis, PND, leg swelling Denies chest pain or edema     No Known Allergies  Immunization History  Administered Date(s) Administered   Influenza,inj,Quad PF,6+ Mos 09/17/2014, 12/07/2016, 09/19/2017, 12/24/2020, 10/19/2021   PFIZER(Purple Top)SARS-COV-2 Vaccination 04/19/2019, 05/14/2019   Tdap 03/08/2016    Tobacco History: Social History   Tobacco Use  Smoking Status Some Days   Types: Cigarettes  Smokeless Tobacco Never   Ready to quit: Not Answered Counseling given: Not Answered   Outpatient Encounter Medications as of 10/06/2022  Medication Sig   docusate sodium (COLACE) 100 MG capsule Take 1 capsule (100 mg total) by mouth 2 (two) times daily.   omeprazole (PRILOSEC) 40 MG capsule TAKE 1 CAPSULE(40 MG) BY MOUTH DAILY   meloxicam (MOBIC) 7.5 MG tablet Take 1 tablet (7.5 mg total) by mouth daily. (Patient not taking:  Reported on 01/28/2022)   pravastatin (PRAVACHOL) 40 MG tablet Take 1 tablet (40 mg total) by mouth daily.   No facility-administered encounter medications on file as of 10/06/2022.    Review of Systems  Review of Systems  Constitutional: Negative.   HENT: Negative.    Cardiovascular: Negative.   Gastrointestinal: Negative.   Allergic/Immunologic: Negative.   Neurological: Negative.   Psychiatric/Behavioral: Negative.       Objective:   BP (!) 139/58   Pulse 67   Temp (!) 97.3 F (36.3 C)   Wt 164 lb 12.8 oz (74.8 kg)   SpO2 100%   BMI 25.06 kg/m   Wt Readings from Last 5 Encounters:  10/06/22 164 lb 12.8 oz (74.8 kg)  05/05/22 165 lb (74.8 kg)  04/29/22 165 lb 3.2 oz (74.9 kg)  02/28/22 165 lb (74.8 kg)  01/28/22 162 lb 3.2 oz (73.6 kg)     Physical Exam Vitals and nursing note reviewed.  Constitutional:      General: She is not in acute distress.    Appearance: She is well-developed.  Cardiovascular:     Rate and Rhythm: Normal rate and regular rhythm.  Pulmonary:     Effort: Pulmonary effort is normal.     Breath sounds: Normal breath sounds.  Neurological:     Mental Status: She is alert and oriented to person, place, and time.  Assessment & Plan:   Need for influenza vaccination -     Flu vaccine trivalent PF, 6mos and older(Flulaval,Afluria,Fluarix,Fluzone)  Thyroid disorder screen -     TSH  Vitamin B12 deficiency -     Vitamin B12  Vitamin D deficiency -     VITAMIN D 25 Hydroxy (Vit-D Deficiency, Fractures)  Nausea and vomiting, unspecified vomiting type -     CBC -     Comprehensive metabolic panel  Prediabetes -     POCT glycosylated hemoglobin (Hb A1C)  Loss of appetite -     Ambulatory referral to Gastroenterology     Return in about 4 weeks (around 11/03/2022) for decreased appetite.   Ivonne Andrew, NP 10/06/2022

## 2022-10-10 LAB — COMPREHENSIVE METABOLIC PANEL

## 2022-10-10 LAB — TSH

## 2022-10-10 LAB — CBC
Hematocrit: 45.3 % (ref 34.0–46.6)
Hemoglobin: 14.5 g/dL (ref 11.1–15.9)
MCH: 29.2 pg (ref 26.6–33.0)
MCHC: 32 g/dL (ref 31.5–35.7)
MCV: 91 fL (ref 79–97)
Platelets: 250 10*3/uL (ref 150–450)
RBC: 4.97 x10E6/uL (ref 3.77–5.28)
RDW: 12.8 % (ref 11.7–15.4)
WBC: 5.5 10*3/uL (ref 3.4–10.8)

## 2022-10-10 LAB — VITAMIN D 25 HYDROXY (VIT D DEFICIENCY, FRACTURES): Vit D, 25-Hydroxy: 36.3 ng/mL (ref 30.0–100.0)

## 2022-10-10 LAB — VITAMIN B12

## 2022-10-11 ENCOUNTER — Other Ambulatory Visit: Payer: Self-pay

## 2022-10-11 DIAGNOSIS — E538 Deficiency of other specified B group vitamins: Secondary | ICD-10-CM

## 2022-10-11 DIAGNOSIS — R112 Nausea with vomiting, unspecified: Secondary | ICD-10-CM

## 2022-10-11 DIAGNOSIS — Z1329 Encounter for screening for other suspected endocrine disorder: Secondary | ICD-10-CM

## 2022-10-12 ENCOUNTER — Other Ambulatory Visit: Payer: Medicaid Other

## 2022-10-12 DIAGNOSIS — R112 Nausea with vomiting, unspecified: Secondary | ICD-10-CM

## 2022-10-12 DIAGNOSIS — E538 Deficiency of other specified B group vitamins: Secondary | ICD-10-CM | POA: Diagnosis not present

## 2022-10-12 DIAGNOSIS — Z1329 Encounter for screening for other suspected endocrine disorder: Secondary | ICD-10-CM

## 2022-10-13 LAB — COMPREHENSIVE METABOLIC PANEL
ALT: 14 [IU]/L (ref 0–32)
AST: 18 [IU]/L (ref 0–40)
Albumin: 4 g/dL (ref 3.9–4.9)
Alkaline Phosphatase: 85 [IU]/L (ref 44–121)
BUN/Creatinine Ratio: 17 (ref 12–28)
BUN: 12 mg/dL (ref 8–27)
Bilirubin Total: 0.3 mg/dL (ref 0.0–1.2)
CO2: 21 mmol/L (ref 20–29)
Calcium: 9.3 mg/dL (ref 8.7–10.3)
Chloride: 105 mmol/L (ref 96–106)
Creatinine, Ser: 0.71 mg/dL (ref 0.57–1.00)
Globulin, Total: 2.5 g/dL (ref 1.5–4.5)
Glucose: 101 mg/dL — ABNORMAL HIGH (ref 70–99)
Potassium: 4.1 mmol/L (ref 3.5–5.2)
Sodium: 141 mmol/L (ref 134–144)
Total Protein: 6.5 g/dL (ref 6.0–8.5)
eGFR: 97 mL/min/{1.73_m2} (ref >59)

## 2022-10-13 LAB — VITAMIN B12: Vitamin B-12: 698 pg/mL (ref 232–1245)

## 2022-10-13 LAB — TSH: TSH: 1.44 u[IU]/mL (ref 0.450–4.500)

## 2022-10-26 ENCOUNTER — Other Ambulatory Visit: Payer: Self-pay | Admitting: Nurse Practitioner

## 2022-10-26 DIAGNOSIS — Z13220 Encounter for screening for lipoid disorders: Secondary | ICD-10-CM

## 2022-10-29 ENCOUNTER — Encounter: Payer: Self-pay | Admitting: Nurse Practitioner

## 2022-10-29 ENCOUNTER — Encounter: Payer: Self-pay | Admitting: Gastroenterology

## 2022-10-29 ENCOUNTER — Ambulatory Visit (INDEPENDENT_AMBULATORY_CARE_PROVIDER_SITE_OTHER): Payer: Medicaid Other | Admitting: Nurse Practitioner

## 2022-10-29 VITALS — BP 135/69 | HR 65 | Temp 98.9°F | Ht 64.0 in | Wt 165.0 lb

## 2022-10-29 DIAGNOSIS — K219 Gastro-esophageal reflux disease without esophagitis: Secondary | ICD-10-CM

## 2022-10-29 NOTE — Progress Notes (Unsigned)
   Subjective   Patient ID: April Bell, female    DOB: May 04, 1960, 62 y.o.   MRN: [id number]  Chief Complaint  Patient presents with   Medical Management of Chronic Issues    Referring provider: Ivonne Andrew, NP  April Bell is a 62 y.o. female with Past Medical History: No date: Hyperlipidemia   HPI  Recommended at last visit to eat 6 small meals per day and this has improved her symptoms she is no longer vomiting. Patient states that for several years she has had an issue with vomiting when she eats a full meal.  She does take omeprazole.  She states that her bowel movements have been regular and her last bowel movement was yesterday.  We will place a referral to GI for further evaluation. Denies f/c/s, n/v/d, hemoptysis, PND, leg swelling. Denies chest pain or edema.   No Known Allergies  Immunization History  Administered Date(s) Administered   Influenza, Seasonal, Injecte, Preservative Fre 10/06/2022   Influenza,inj,Quad PF,6+ Mos 09/17/2014, 12/07/2016, 09/19/2017, 12/24/2020, 10/19/2021   PFIZER(Purple Top)SARS-COV-2 Vaccination 04/19/2019, 05/14/2019   Tdap 03/08/2016    Tobacco History: Social History   Tobacco Use  Smoking Status Some Days   Types: Cigarettes  Smokeless Tobacco Never   Ready to quit: Not Answered Counseling given: Not Answered   Outpatient Encounter Medications as of 10/29/2022  Medication Sig   docusate sodium (COLACE) 100 MG capsule Take 1 capsule (100 mg total) by mouth 2 (two) times daily.   omeprazole (PRILOSEC) 40 MG capsule TAKE 1 CAPSULE(40 MG) BY MOUTH DAILY   pravastatin (PRAVACHOL) 40 MG tablet TAKE 1 TABLET(40 MG) BY MOUTH DAILY   [DISCONTINUED] meloxicam (MOBIC) 7.5 MG tablet Take 1 tablet (7.5 mg total) by mouth daily. (Patient not taking: Reported on 01/28/2022)   No facility-administered encounter medications on file as of 10/29/2022.    Review of Systems  Review of Systems  Constitutional:  Negative.   HENT: Negative.    Cardiovascular: Negative.   Gastrointestinal: Negative.   Allergic/Immunologic: Negative.   Neurological: Negative.   Psychiatric/Behavioral: Negative.       Objective:   BP 135/69   Pulse 65   Temp 98.9 F (37.2 C) (Oral)   Ht 5\' 4"  (1.626 m)   Wt 165 lb (74.8 kg)   SpO2 100%   BMI 28.32 kg/m   Wt Readings from Last 5 Encounters:  10/29/22 165 lb (74.8 kg)  10/06/22 164 lb 12.8 oz (74.8 kg)  05/05/22 165 lb (74.8 kg)  04/29/22 165 lb 3.2 oz (74.9 kg)  02/28/22 165 lb (74.8 kg)     Physical Exam Vitals and nursing note reviewed.  Constitutional:      General: She is not in acute distress.    Appearance: She is well-developed.  Cardiovascular:     Rate and Rhythm: Normal rate and regular rhythm.  Pulmonary:     Effort: Pulmonary effort is normal.     Breath sounds: Normal breath sounds.  Neurological:     Mental Status: She is alert and oriented to person, place, and time.     {Labs (Optional):23779}  Assessment & Plan:   There are no diagnoses linked to this encounter.   Return in about 3 months (around 01/29/2023).   Ivonne Andrew, NP 10/29/2022

## 2022-10-29 NOTE — Progress Notes (Unsigned)
Patient states [facility] never contacted her to schedule an appointment or anything

## 2022-11-01 ENCOUNTER — Encounter: Payer: Self-pay | Admitting: Nurse Practitioner

## 2022-11-03 ENCOUNTER — Ambulatory Visit: Payer: Self-pay | Admitting: Nurse Practitioner

## 2022-11-05 DIAGNOSIS — Z419 Encounter for procedure for purposes other than remedying health state, unspecified: Secondary | ICD-10-CM | POA: Diagnosis not present

## 2022-12-05 DIAGNOSIS — Z419 Encounter for procedure for purposes other than remedying health state, unspecified: Secondary | ICD-10-CM | POA: Diagnosis not present

## 2023-01-05 DIAGNOSIS — Z419 Encounter for procedure for purposes other than remedying health state, unspecified: Secondary | ICD-10-CM | POA: Diagnosis not present

## 2023-01-27 ENCOUNTER — Ambulatory Visit: Payer: Medicaid Other | Admitting: Gastroenterology

## 2023-01-27 ENCOUNTER — Encounter: Payer: Self-pay | Admitting: Gastroenterology

## 2023-01-27 VITALS — BP 120/68 | HR 64 | Ht 67.0 in | Wt 171.0 lb

## 2023-01-27 DIAGNOSIS — K625 Hemorrhage of anus and rectum: Secondary | ICD-10-CM | POA: Diagnosis not present

## 2023-01-27 DIAGNOSIS — R12 Heartburn: Secondary | ICD-10-CM | POA: Diagnosis not present

## 2023-01-27 DIAGNOSIS — K5909 Other constipation: Secondary | ICD-10-CM

## 2023-01-27 DIAGNOSIS — R112 Nausea with vomiting, unspecified: Secondary | ICD-10-CM | POA: Diagnosis not present

## 2023-01-27 DIAGNOSIS — R1084 Generalized abdominal pain: Secondary | ICD-10-CM

## 2023-01-27 NOTE — Patient Instructions (Addendum)
You have been scheduled for an abdominal ultrasound at [facility] (1st floor of hospital) on  at   . Please arrive 30 minutes prior to your appointment for registration. Make certain not to have anything to eat or drink 6 hours prior to your appointment. Should you need to reschedule your appointment, please contact radiology at [phone]. This test typically takes about 30 minutes to perform.  You have been scheduled for an endoscopy. Please follow written instructions given to you at your visit today.  If you use inhalers (even only as needed), please bring them with you on the day of your procedure.  If you take any of the following medications, they will need to be adjusted prior to your procedure:   DO NOT TAKE 7 DAYS PRIOR TO TEST- Trulicity (dulaglutide) Ozempic, Wegovy (semaglutide) Mounjaro (tirzepatide) Bydureon Bcise (exanatide extended release)  DO NOT TAKE 1 DAY PRIOR TO YOUR TEST Rybelsus (semaglutide) Adlyxin (lixisenatide) Victoza (liraglutide) Byetta (exanatide) ___________________________________________________________________________    _______________________________________________________  If your blood pressure at your visit was 140/90 or greater, please contact your primary care physician to follow up on this.  _______________________________________________________  If you are age 62 or older, your body mass index should be between 23-30. Your Body mass index is 26.78 kg/m. If this is out of the aforementioned range listed, please consider follow up with your Primary Care Provider.  If you are age 61 or younger, your body mass index should be between 19-25. Your Body mass index is 26.78 kg/m. If this is out of the aformentioned range listed, please consider follow up with your Primary Care Provider.   ________________________________________________________  The [facility] providers would like to encourage you to use [facility] to communicate  with providers for non-urgent requests or questions.  Due to long hold times on the telephone, sending your provider a message by [facility] may be a faster and more efficient way to get a response.  Please allow 48 business hours for a response.  Please remember that this is for non-urgent requests.  _______________________________________________________ It was a pleasure to see you today!  Thank you for trusting me with your gastrointestinal care!

## 2023-01-27 NOTE — Progress Notes (Signed)
[facility] Consult Note:  History: April Bell 01/27/2023  Referring provider: Ivonne Andrew, NP  Reason for consult/chief complaint: Gastroesophageal Reflux and Emesis   Subjective  Prior history:  No prior GI Hx  From PCP note at time of referral 10/29/22: "Patient presents today for follow-up visit.  Patient was seen at last visit for decreased appetite and vomiting when eating regular meals.  Recommended at last visit to eat 6 small meals per day and this has improved her symptoms she is no longer vomiting. Patient states that for several years she has had an issue with vomiting when she eats a full meal.  She does take omeprazole.  She states that her bowel movements have been regular and her last bowel movement was yesterday.  We will place a referral to GI for further evaluation. Denies f/c/s, n/v/d, hemoptysis, PND, leg swelling. Denies chest pain or edema "  _____________________  (Arabic interpreter Dione Housekeeper present for entire visit)    April Bell reports many years of frequent heartburn, nausea and episodic vomiting for which she was reportedly evaluated by a physician in [place] perhaps as long as 20 years ago.  She does not recall the findings or any specific treatments afterward.  She has continued to have the symptoms frequently and has not seen a GI physician here in [place].  Denies dysphagia or odynophagia.  She also has episodes of fairly generalized but mostly periumbilic abdominal crampy pain that is not necessarily associated with meals or bowel movements.  She tends toward constipation and takes some stool softeners that seem to afford relief.  She has intermittent bleeding that she believes is probably hemorrhoidal, and has not had a prior colonoscopy.  Generally appetite is good and her weight stable. No diarrhea  ROS:  Review of Systems  Constitutional:  Negative for appetite change and unexpected weight change.  HENT:   Negative for mouth sores and voice change.   Eyes:  Negative for pain and redness.  Respiratory:  Negative for cough and shortness of breath.   Cardiovascular:  Negative for chest pain and palpitations.  Genitourinary:  Negative for dysuria and hematuria.  Musculoskeletal:  Negative for arthralgias and myalgias.  Skin:  Negative for pallor and rash.  Neurological:  Negative for weakness and headaches.  Hematological:  Negative for adenopathy.     Past Medical History: Past Medical History:  Diagnosis Date   Hyperlipidemia      Past Surgical History: Past Surgical History:  Procedure Laterality Date   NO PAST SURGERIES    No abdominal or pelvic surgery   Family History: Family History  Problem Relation Age of Onset   Hypertension Mother    Diabetes Brother    Breast cancer Neg Hx    Colon cancer Neg Hx    Stomach cancer Neg Hx    Esophageal cancer Neg Hx     Social History: Social History   Socioeconomic History   Marital status: Widowed    Spouse name: Not on file   Number of children: 4   Years of education: Not on file   Highest education level: 12th grade  Occupational History   Not on file  Tobacco Use   Smoking status: Never   Smokeless tobacco: Never  Vaping Use   Vaping status: Former   Substances: Flavoring  Substance and Sexual Activity   Alcohol use: No    Alcohol/week: 0.0 standard drinks of alcohol   Drug use: No  Sexual activity: Not Currently    Birth control/protection: None  Other Topics Concern   Not on file  Social History Narrative   ** Merged History Encounter **       Social Drivers of Health   Financial Resource Strain: Low Risk  (10/27/2022)   Overall Financial Resource Strain (CARDIA)    Difficulty of Paying Living Expenses: Not very hard  Food Insecurity: Patient Declined (10/27/2022)   Hunger Vital Sign    Worried About Running Out of Food in the Last Year: Patient declined    Ran Out of Food in the Last Year:  Patient declined  Transportation Needs: Patient Declined (10/27/2022)   PRAPARE - Administrator, Civil Service (Medical): Patient declined    Lack of Transportation (Non-Medical): Patient declined  Physical Activity: Not on file  Stress: Not on file  Social Connections: Unknown (10/27/2022)   Social Connection and Isolation Panel [NHANES]    Frequency of Communication with Friends and Family: More than three times a week    Frequency of Social Gatherings with Friends and Family: More than three times a week    Attends Religious Services: More than 4 times per year    Active Member of [employer] or Organizations: Not on file    Attends Banker Meetings: Not on file    Marital Status: Widowed  Has lived in [place] and [place]  Allergies: No Known Allergies  Outpatient Meds: Current Outpatient Medications  Medication Sig Dispense Refill   docusate sodium (COLACE) 100 MG capsule Take 1 capsule (100 mg total) by mouth 2 (two) times daily. 10 capsule 0   omeprazole (PRILOSEC) 40 MG capsule TAKE 1 CAPSULE(40 MG) BY MOUTH DAILY 30 capsule 3   pravastatin (PRAVACHOL) 40 MG tablet TAKE 1 TABLET(40 MG) BY MOUTH DAILY 90 tablet 1   No current facility-administered medications for this visit.      ___________________________________________________________________ Objective   Exam:  BP 120/68   Pulse 64   Ht 5\' 7"  (1.702 m)   Wt 171 lb (77.6 kg)   SpO2 99%   BMI 26.78 kg/m  Wt Readings from Last 3 Encounters:  01/27/23 171 lb (77.6 kg)  10/29/22 165 lb (74.8 kg)  10/06/22 164 lb 12.8 oz (74.8 kg)    General: Well-appearing Eyes: sclera anicteric, no redness ENT: oral mucosa moist without lesions, no cervical or supraclavicular lymphadenopathy CV: Regular without appreciable murmur, no JVD, no peripheral edema Resp: clear to auscultation bilaterally, normal RR and effort noted GI: soft, no focal tenderness, with active bowel sounds. No guarding or  palpable organomegaly noted.  No bruit Skin; warm and dry, no rash or jaundice noted Neuro: awake, alert and oriented x 3. Normal gross motor function and fluent speech Declined rectal exam  Labs:     Latest Ref Rng & Units 10/06/2022   12:09 PM 04/29/2022   10:57 AM 02/28/2022   12:52 PM  CBC  WBC 3.4 - 10.8 x10E3/uL 5.5  5.4  4.8   Hemoglobin 11.1 - 15.9 g/dL 16.1  09.6  04.5   Hematocrit 34.0 - 46.6 % 45.3  41.6  40.5   Platelets 150 - 450 x10E3/uL 250  250  230       Latest Ref Rng & Units 10/12/2022   10:10 AM 10/06/2022   12:09 PM 04/29/2022   10:57 AM  CMP  Glucose 70 - 99 mg/dL 409  CANCELED  811   BUN 8 - 27 mg/dL 12  CANCELED  17   Creatinine 0.57 - 1.00 mg/dL 1.61  CANCELED  0.96   Sodium 134 - 144 mmol/L 141  CANCELED  140   Potassium 3.5 - 5.2 mmol/L 4.1  CANCELED  4.3   Chloride 96 - 106 mmol/L 105  CANCELED  105   CO2 20 - 29 mmol/L 21  CANCELED  22   Calcium 8.7 - 10.3 mg/dL 9.3  CANCELED  9.2   Total Protein 6.0 - 8.5 g/dL 6.5  CANCELED  6.8   Total Bilirubin 0.0 - 1.2 mg/dL 0.3  CANCELED  <0.4   Alkaline Phos 44 - 121 IU/L 85  CANCELED  82   AST 0 - 40 IU/L 18  CANCELED  13   ALT 0 - 32 IU/L 14  CANCELED  13      Radiologic Studies:  No abdominal imaging on file   Encounter Diagnoses  Name Primary?   Generalized abdominal pain Yes   Nausea and vomiting in adult    Heartburn    Chronic constipation    Rectal bleeding     Assessment and Plan Multiple digestive symptoms including chronic abdominal pain, frequent heartburn nausea and vomiting.  Unknown findings on distant upper endoscopy done out of this country.  (Thus records not available) Chronic constipation with some intermittent bleeding. Broad differential PPI reportedly gives some relief of the heartburn but symptoms persist.  Stool softeners seem to help regulate her bowel habits but she still has intermittent bleeding.  Recommended ultrasound to evaluate for any fluid collection (though  no clear ascites or distention on exam), and mostly look for gallstones.  Recommended upper endoscopy after describing the procedure along with diagrams of the anatomy and risks and benefits and she was agreeable.  The benefits and risks of the planned procedure were described in detail with the patient or (when appropriate) their health care proxy.  Risks were outlined as including, but not limited to, bleeding, infection, perforation, adverse medication reaction leading to cardiac or pulmonary decompensation, pancreatitis (if ERCP).  The limitation of incomplete mucosal visualization was also discussed.  No guarantees or warranties were given.  Colonoscopy also recommended in rationale for that test explained in detail.  She politely declined.  I checked with the patient through the interpreter just to be sure it was not because she preferred to have that done by a female endoscopist, but she said that was not the issue and she just did not feel it was necessary for the problem she is having.  Continue current management awaiting results of tests.  May need further abdominal imaging if endoscopy and ultrasound unrevealing.  Thank you for the courtesy of this consult.  Please call me with any questions or concerns.  Charlie Pitter III  CC: Referring provider noted above

## 2023-01-28 ENCOUNTER — Other Ambulatory Visit: Payer: Self-pay | Admitting: Nurse Practitioner

## 2023-01-28 DIAGNOSIS — Z13220 Encounter for screening for lipoid disorders: Secondary | ICD-10-CM

## 2023-01-31 ENCOUNTER — Ambulatory Visit (INDEPENDENT_AMBULATORY_CARE_PROVIDER_SITE_OTHER): Payer: Medicaid Other | Admitting: Nurse Practitioner

## 2023-01-31 ENCOUNTER — Encounter: Payer: Self-pay | Admitting: Nurse Practitioner

## 2023-01-31 VITALS — BP 174/78 | HR 71 | Temp 97.1°F | Wt 167.0 lb

## 2023-01-31 DIAGNOSIS — G8929 Other chronic pain: Secondary | ICD-10-CM

## 2023-01-31 DIAGNOSIS — Z13220 Encounter for screening for lipoid disorders: Secondary | ICD-10-CM | POA: Diagnosis not present

## 2023-01-31 DIAGNOSIS — R030 Elevated blood-pressure reading, without diagnosis of hypertension: Secondary | ICD-10-CM

## 2023-01-31 DIAGNOSIS — R7303 Prediabetes: Secondary | ICD-10-CM

## 2023-01-31 DIAGNOSIS — M25562 Pain in left knee: Secondary | ICD-10-CM

## 2023-01-31 MED ORDER — LISINOPRIL 5 MG PO TABS: 5.0000 mg | ORAL_TABLET | Freq: Every day | ORAL | 3 refills | Status: DC

## 2023-01-31 MED ORDER — CLONIDINE HCL 0.1 MG PO TABS
0.1000 mg | ORAL_TABLET | Freq: Once | ORAL | Status: AC
  Administered 2023-01-31: 0.1 mg via ORAL

## 2023-01-31 MED ORDER — OMEPRAZOLE 40 MG PO CPDR: 40.0000 mg | DELAYED_RELEASE_CAPSULE | Freq: Every day | ORAL | 3 refills | Status: DC

## 2023-01-31 MED ORDER — PREDNISONE 20 MG PO TABS: 20.0000 mg | ORAL_TABLET | Freq: Every day | ORAL | 0 refills | Status: AC

## 2023-01-31 NOTE — Patient Instructions (Signed)
1. Chronic pain of left knee (Primary)  - AMB referral to orthopedics - predniSONE (DELTASONE) 20 MG tablet; Take 1 tablet (20 mg total) by mouth daily with breakfast for 5 days.  Dispense: 5 tablet; Refill: 0  2. Lipid screening  - Lipid Panel  3. Prediabetes  - CBC - Comprehensive metabolic panel  Follow up:  Follow up in 6 months

## 2023-01-31 NOTE — Progress Notes (Addendum)
Subjective   Patient ID: April Bell, female    DOB: 26-Nov-1960, 63 y.o.   MRN: [id number]  Chief Complaint  Patient presents with   Knee Pain    Left knee pain, had previous injection was given. Her knee is swelling now     Referring provider: Ivonne Andrew, NP  April Bell is a 63 y.o. female with Past Medical History: No date: Hyperlipidemia   HPI  Patient presents today for a follow-up visit.  Overall she has been doing well other than chronic left knee pain.  She has been seen by Ortho for this before and has had injections.  We will place a referral back to Ortho for her today.  We will trial prednisone in the meantime. Denies f/c/s, n/v/d, hemoptysis, PND, leg swelling Denies chest pain or edema     No Known Allergies  Immunization History  Administered Date(s) Administered   Influenza, Seasonal, Injecte, Preservative Fre 10/06/2022   Influenza,inj,Quad PF,6+ Mos 09/17/2014, 12/07/2016, 09/19/2017, 12/24/2020, 10/19/2021   PFIZER(Purple Top)SARS-COV-2 Vaccination 04/19/2019, 05/14/2019   Tdap 03/08/2016    Tobacco History: Social History   Tobacco Use  Smoking Status Never  Smokeless Tobacco Never   Counseling given: Not Answered   Outpatient Encounter Medications as of 01/31/2023  Medication Sig   docusate sodium (COLACE) 100 MG capsule Take 1 capsule (100 mg total) by mouth 2 (two) times daily.   lisinopril (ZESTRIL) 5 MG tablet Take 1 tablet (5 mg total) by mouth daily.   pravastatin (PRAVACHOL) 40 MG tablet TAKE 1 TABLET(40 MG) BY MOUTH DAILY   predniSONE (DELTASONE) 20 MG tablet Take 1 tablet (20 mg total) by mouth daily with breakfast for 5 days.   [DISCONTINUED] omeprazole (PRILOSEC) 40 MG capsule TAKE 1 CAPSULE(40 MG) BY MOUTH DAILY   omeprazole (PRILOSEC) 40 MG capsule Take 1 capsule (40 mg total) by mouth daily.   Facility-Administered Encounter Medications as of 01/31/2023  Medication   cloNIDine (CATAPRES) tablet 0.1 mg     Review of Systems  Review of Systems  Constitutional: Negative.   HENT: Negative.    Cardiovascular: Negative.   Gastrointestinal: Negative.   Allergic/Immunologic: Negative.   Neurological: Negative.   Psychiatric/Behavioral: Negative.       Objective:   BP (!) 174/78   Pulse 71   Temp (!) 97.1 F (36.2 C)   Wt 167 lb (75.8 kg)   SpO2 100%   BMI 26.16 kg/m   Wt Readings from Last 5 Encounters:  01/31/23 167 lb (75.8 kg)  01/27/23 171 lb (77.6 kg)  10/29/22 165 lb (74.8 kg)  10/06/22 164 lb 12.8 oz (74.8 kg)  05/05/22 165 lb (74.8 kg)     Physical Exam Vitals and nursing note reviewed.  Constitutional:      General: She is not in acute distress.    Appearance: She is well-developed.  Cardiovascular:     Rate and Rhythm: Normal rate and regular rhythm.  Pulmonary:     Effort: Pulmonary effort is normal.     Breath sounds: Normal breath sounds.  Neurological:     Mental Status: She is alert and oriented to person, place, and time.       Assessment & Plan:   Chronic pain of left knee -     Ambulatory referral to Orthopedics -     predniSONE; Take 1 tablet (20 mg total) by mouth daily with breakfast for 5 days.  Dispense: 5 tablet; Refill: 0  Lipid  screening -     Lipid panel  Prediabetes -     CBC -     Comprehensive metabolic panel  Elevated blood pressure reading -     cloNIDine HCl -     Lisinopril; Take 1 tablet (5 mg total) by mouth daily.  Dispense: 90 tablet; Refill: 3  Other orders -     Omeprazole; Take 1 capsule (40 mg total) by mouth daily.  Dispense: 30 capsule; Refill: 3     Return in about 6 months (around 07/31/2023).   Ivonne Andrew, NP 01/31/2023

## 2023-01-31 NOTE — Addendum Note (Signed)
Addended by: Ivonne Andrew on: 01/31/2023 11:40 AM   Modules accepted: Orders

## 2023-02-01 LAB — CBC
Hematocrit: 43.2 % (ref 34.0–46.6)
Hemoglobin: 14.2 g/dL (ref 11.1–15.9)
MCH: 29.2 pg (ref 26.6–33.0)
MCHC: 32.9 g/dL (ref 31.5–35.7)
MCV: 89 fL (ref 79–97)
Platelets: 254 10*3/uL (ref 150–450)
RBC: 4.86 x10E6/uL (ref 3.77–5.28)
RDW: 12.6 % (ref 11.7–15.4)
WBC: 6 10*3/uL (ref 3.4–10.8)

## 2023-02-01 LAB — COMPREHENSIVE METABOLIC PANEL
ALT: 21 [IU]/L (ref 0–32)
AST: 22 [IU]/L (ref 0–40)
Albumin: 3.9 g/dL (ref 3.9–4.9)
Alkaline Phosphatase: 93 [IU]/L (ref 44–121)
BUN/Creatinine Ratio: 14 (ref 12–28)
BUN: 11 mg/dL (ref 8–27)
Bilirubin Total: 0.2 mg/dL (ref 0.0–1.2)
CO2: 25 mmol/L (ref 20–29)
Calcium: 9.5 mg/dL (ref 8.7–10.3)
Chloride: 102 mmol/L (ref 96–106)
Creatinine, Ser: 0.76 mg/dL (ref 0.57–1.00)
Globulin, Total: 3.2 g/dL (ref 1.5–4.5)
Glucose: 102 mg/dL — ABNORMAL HIGH (ref 70–99)
Potassium: 4.1 mmol/L (ref 3.5–5.2)
Sodium: 140 mmol/L (ref 134–144)
Total Protein: 7.1 g/dL (ref 6.0–8.5)
eGFR: 89 mL/min/{1.73_m2} (ref >59)

## 2023-02-01 LAB — LIPID PANEL
Chol/HDL Ratio: 3 {ratio} (ref 0.0–4.4)
Cholesterol, Total: 246 mg/dL — ABNORMAL HIGH (ref 100–199)
HDL: 81 mg/dL (ref >39)
LDL Chol Calc (NIH): 147 mg/dL — ABNORMAL HIGH (ref 0–99)
Triglycerides: 107 mg/dL (ref 0–149)
VLDL Cholesterol Cal: 18 mg/dL (ref 5–40)

## 2023-02-05 DIAGNOSIS — Z419 Encounter for procedure for purposes other than remedying health state, unspecified: Secondary | ICD-10-CM | POA: Diagnosis not present

## 2023-02-14 ENCOUNTER — Encounter: Payer: Self-pay | Admitting: Nurse Practitioner

## 2023-02-14 ENCOUNTER — Ambulatory Visit (INDEPENDENT_AMBULATORY_CARE_PROVIDER_SITE_OTHER): Payer: Medicaid Other | Admitting: Nurse Practitioner

## 2023-02-14 VITALS — BP 150/71 | HR 72 | Temp 97.9°F | Wt 172.2 lb

## 2023-02-14 DIAGNOSIS — I10 Essential (primary) hypertension: Secondary | ICD-10-CM

## 2023-02-14 DIAGNOSIS — R200 Anesthesia of skin: Secondary | ICD-10-CM

## 2023-02-14 MED ORDER — LISINOPRIL-HYDROCHLOROTHIAZIDE 10-12.5 MG PO TABS
1.0000 | ORAL_TABLET | Freq: Every day | ORAL | 3 refills | Status: AC
Start: 2023-02-14 — End: ?

## 2023-02-14 NOTE — Patient Instructions (Addendum)
 1. Primary hypertension (Primary)  - lisinopril -hydrochlorothiazide  (ZESTORETIC ) 10-12.5 MG tablet; Take 1 tablet by mouth daily.  Dispense: 90 tablet; Refill: 3   2. Hand numbness  - Ambulatory referral to Neurology   Follow up:  Follow up 1 months

## 2023-02-14 NOTE — Progress Notes (Addendum)
   Subjective   Patient ID: April Bell, female    DOB: 10/18/1960, 63 y.o.   MRN: [id number]  Chief Complaint  Patient presents with   Follow-up    Referring provider: Ivonne Andrew, NP  April Bell is a 63 y.o. female with Past Medical History: No date: Hyperlipidemia  HPI  Patient presents today for follow-up visit.  Her blood pressure is elevated in office today.  She states that she did take her medication today.  We will trial lisinopril with HCTZ.  Will have patient return in 1 month.  Patient also complains today about hand numbness and tingling that has been present for a few months. We will place a referral to neurology for further evaluation.    Denies f/c/s, n/v/d, hemoptysis, PND, leg swelling Denies chest pain or edema        No Known Allergies  Immunization History  Administered Date(s) Administered   Influenza, Seasonal, Injecte, Preservative Fre 10/06/2022   Influenza,inj,Quad PF,6+ Mos 09/17/2014, 12/07/2016, 09/19/2017, 12/24/2020, 10/19/2021   PFIZER(Purple Top)SARS-COV-2 Vaccination 04/19/2019, 05/14/2019   Tdap 03/08/2016    Tobacco History: Social History   Tobacco Use  Smoking Status Never  Smokeless Tobacco Never   Counseling given: Not Answered   Outpatient Encounter Medications as of 02/14/2023  Medication Sig   docusate sodium (COLACE) 100 MG capsule Take 1 capsule (100 mg total) by mouth 2 (two) times daily.   lisinopril-hydrochlorothiazide (ZESTORETIC) 10-12.5 MG tablet Take 1 tablet by mouth daily.   omeprazole (PRILOSEC) 40 MG capsule Take 1 capsule (40 mg total) by mouth daily.   pravastatin (PRAVACHOL) 40 MG tablet TAKE 1 TABLET(40 MG) BY MOUTH DAILY   [DISCONTINUED] lisinopril (ZESTRIL) 5 MG tablet Take 1 tablet (5 mg total) by mouth daily.   No facility-administered encounter medications on file as of 02/14/2023.    Review of Systems  Review of Systems  Constitutional: Negative.   HENT: Negative.     Cardiovascular: Negative.   Gastrointestinal: Negative.   Allergic/Immunologic: Negative.   Neurological: Negative.   Psychiatric/Behavioral: Negative.       Objective:   BP (!) 150/71   Pulse 72   Temp 97.9 F (36.6 C)   Wt 172 lb 3.2 oz (78.1 kg)   SpO2 100%   BMI 26.97 kg/m   Wt Readings from Last 5 Encounters:  03/14/23 172 lb (78 kg)  02/14/23 172 lb 3.2 oz (78.1 kg)  01/31/23 167 lb (75.8 kg)  01/27/23 171 lb (77.6 kg)  10/29/22 165 lb (74.8 kg)     Physical Exam Vitals and nursing note reviewed.  Constitutional:      General: She is not in acute distress.    Appearance: She is well-developed.  Cardiovascular:     Rate and Rhythm: Normal rate and regular rhythm.  Pulmonary:     Effort: Pulmonary effort is normal.     Breath sounds: Normal breath sounds.  Neurological:     Mental Status: She is alert and oriented to person, place, and time.       Assessment & Plan:   Primary hypertension -     Lisinopril-hydroCHLOROthiazide; Take 1 tablet by mouth daily.  Dispense: 90 tablet; Refill: 3  Hand numbness -     Ambulatory referral to Neurology     Return in about 4 weeks (around 03/14/2023) for recheck blood pressur.     Ivonne Andrew, NP 03/14/2023

## 2023-02-16 DIAGNOSIS — S83219A Bucket-handle tear of medial meniscus, current injury, unspecified knee, initial encounter: Secondary | ICD-10-CM | POA: Diagnosis not present

## 2023-02-16 DIAGNOSIS — M25562 Pain in left knee: Secondary | ICD-10-CM | POA: Diagnosis not present

## 2023-02-16 DIAGNOSIS — G8929 Other chronic pain: Secondary | ICD-10-CM | POA: Diagnosis not present

## 2023-03-05 DIAGNOSIS — Z419 Encounter for procedure for purposes other than remedying health state, unspecified: Secondary | ICD-10-CM | POA: Diagnosis not present

## 2023-03-05 DIAGNOSIS — M222X2 Patellofemoral disorders, left knee: Secondary | ICD-10-CM | POA: Diagnosis not present

## 2023-03-05 DIAGNOSIS — R600 Localized edema: Secondary | ICD-10-CM | POA: Diagnosis not present

## 2023-03-05 DIAGNOSIS — M7122 Synovial cyst of popliteal space [Baker], left knee: Secondary | ICD-10-CM | POA: Diagnosis not present

## 2023-03-05 DIAGNOSIS — R609 Edema, unspecified: Secondary | ICD-10-CM | POA: Diagnosis not present

## 2023-03-05 DIAGNOSIS — M25562 Pain in left knee: Secondary | ICD-10-CM | POA: Diagnosis not present

## 2023-03-14 ENCOUNTER — Ambulatory Visit (INDEPENDENT_AMBULATORY_CARE_PROVIDER_SITE_OTHER): Payer: Self-pay | Admitting: Nurse Practitioner

## 2023-03-14 ENCOUNTER — Encounter: Payer: Self-pay | Admitting: Nurse Practitioner

## 2023-03-14 VITALS — BP 137/77 | HR 77 | Temp 97.9°F | Wt 172.0 lb

## 2023-03-14 DIAGNOSIS — R519 Headache, unspecified: Secondary | ICD-10-CM

## 2023-03-14 MED ORDER — CYCLOBENZAPRINE HCL 5 MG PO TABS: 5.0000 mg | ORAL_TABLET | Freq: Three times a day (TID) | ORAL | 1 refills | Status: AC | PRN

## 2023-03-14 NOTE — Patient Instructions (Signed)
 1. Intractable headache, unspecified chronicity pattern, unspecified headache type (Primary)  - cyclobenzaprine (FLEXERIL) 5 MG tablet; Take 1 tablet (5 mg total) by mouth 3 (three) times daily as needed for muscle spasms.  Dispense: 30 tablet; Refill: 1

## 2023-03-14 NOTE — Progress Notes (Signed)
 Subjective   Patient ID: April Bell, female    DOB: 1960/03/08, 63 y.o.   MRN: [id number]  Chief Complaint  Patient presents with   Hypertension    BP follow up    Referring provider: Ivonne Andrew, NP  Seleni S A Labarre is a 63 y.o. female with Past Medical History: No date: Hyperlipidemia  HPI   Patient presents today for a follow-up on blood pressure.  She has been tolerating blood pressure medicine well.  Her blood pressure is good in the office today.  She does not check blood pressures at home.  We did discuss low-salt diet and exercise in office today.  We did use interpreter today.  Patient states that she does have headaches from time to time and they are located at the back of her head and her neck.  We will trial muscle relaxer for this.  She states that this has been an issue since before she had high blood pressure.  Denies f/c/s, n/v/d, hemoptysis, PND, leg swelling Denies chest pain or edema      No Known Allergies  Immunization History  Administered Date(s) Administered   Influenza, Seasonal, Injecte, Preservative Fre 10/06/2022   Influenza,inj,Quad PF,6+ Mos 09/17/2014, 12/07/2016, 09/19/2017, 12/24/2020, 10/19/2021   PFIZER(Purple Top)SARS-COV-2 Vaccination 04/19/2019, 05/14/2019   Tdap 03/08/2016    Tobacco History: Social History   Tobacco Use  Smoking Status Never  Smokeless Tobacco Never   Counseling given: Not Answered   Outpatient Encounter Medications as of 03/14/2023  Medication Sig   cyclobenzaprine (FLEXERIL) 5 MG tablet Take 1 tablet (5 mg total) by mouth 3 (three) times daily as needed for muscle spasms.   docusate sodium (COLACE) 100 MG capsule Take 1 capsule (100 mg total) by mouth 2 (two) times daily.   lisinopril-hydrochlorothiazide (ZESTORETIC) 10-12.5 MG tablet Take 1 tablet by mouth daily.   omeprazole (PRILOSEC) 40 MG capsule Take 1 capsule (40 mg total) by mouth daily.   pravastatin (PRAVACHOL) 40 MG tablet  TAKE 1 TABLET(40 MG) BY MOUTH DAILY   No facility-administered encounter medications on file as of 03/14/2023.    Review of Systems  Review of Systems  Constitutional: Negative.   HENT: Negative.    Cardiovascular: Negative.   Gastrointestinal: Negative.   Allergic/Immunologic: Negative.   Psychiatric/Behavioral: Negative.       Objective:   BP 137/77   Pulse 77   Temp 97.9 F (36.6 C) (Oral)   Wt 172 lb (78 kg)   SpO2 100%   BMI 26.94 kg/m   Wt Readings from Last 5 Encounters:  03/14/23 172 lb (78 kg)  02/14/23 172 lb 3.2 oz (78.1 kg)  01/31/23 167 lb (75.8 kg)  01/27/23 171 lb (77.6 kg)  10/29/22 165 lb (74.8 kg)     Physical Exam Vitals and nursing note reviewed.  Constitutional:      General: She is not in acute distress.    Appearance: She is well-developed.  Cardiovascular:     Rate and Rhythm: Normal rate and regular rhythm.  Pulmonary:     Effort: Pulmonary effort is normal.     Breath sounds: Normal breath sounds.  Neurological:     Mental Status: She is alert and oriented to person, place, and time.       Assessment & Plan:   Intractable headache, unspecified chronicity pattern, unspecified headache type -     Cyclobenzaprine HCl; Take 1 tablet (5 mg total) by mouth 3 (three) times daily as needed  for muscle spasms.  Dispense: 30 tablet; Refill: 1     Return in about 3 months (around 06/14/2023).   Ivonne Andrew, NP 03/14/2023

## 2023-03-22 ENCOUNTER — Encounter: Payer: Self-pay | Admitting: Certified Registered Nurse Anesthetist

## 2023-03-24 ENCOUNTER — Encounter: Payer: Self-pay | Admitting: Gastroenterology

## 2023-03-24 ENCOUNTER — Telehealth: Payer: Self-pay

## 2023-03-24 ENCOUNTER — Ambulatory Visit: Payer: Medicaid Other | Admitting: Gastroenterology

## 2023-03-24 VITALS — BP 152/86 | HR 58 | Temp 98.2°F | Resp 16 | Ht 68.0 in | Wt 172.0 lb

## 2023-03-24 DIAGNOSIS — R12 Heartburn: Secondary | ICD-10-CM

## 2023-03-24 DIAGNOSIS — I10 Essential (primary) hypertension: Secondary | ICD-10-CM | POA: Diagnosis not present

## 2023-03-24 DIAGNOSIS — R1084 Generalized abdominal pain: Secondary | ICD-10-CM

## 2023-03-24 DIAGNOSIS — K2950 Unspecified chronic gastritis without bleeding: Secondary | ICD-10-CM | POA: Diagnosis not present

## 2023-03-24 DIAGNOSIS — R110 Nausea: Secondary | ICD-10-CM

## 2023-03-24 DIAGNOSIS — E785 Hyperlipidemia, unspecified: Secondary | ICD-10-CM | POA: Diagnosis not present

## 2023-03-24 DIAGNOSIS — B9681 Helicobacter pylori [H. pylori] as the cause of diseases classified elsewhere: Secondary | ICD-10-CM | POA: Diagnosis not present

## 2023-03-24 DIAGNOSIS — K3189 Other diseases of stomach and duodenum: Secondary | ICD-10-CM

## 2023-03-24 MED ORDER — SODIUM CHLORIDE 0.9 % IV SOLN: 500.0000 mL | Freq: Once | INTRAVENOUS | Status: DC

## 2023-03-24 NOTE — Op Note (Signed)
 [facility] Patient Name: April Bell Procedure Date: 03/24/2023 9:44 AM MRN: [id number] Endoscopist: Sherilyn Cooter L. Myrtie Neither , MD, [id number] Age: 63 Referring MD:  Date of Birth: 11-Sep-1960 Gender: Female Account #: [id number] Procedure:                Upper GI endoscopy Indications:              Generalized abdominal pain, Heartburn, Nausea                           clinical details in 01/27/23 office consult note. Medicines:                Monitored Anesthesia Care Procedure:                Pre-Anesthesia Assessment:                           - Prior to the procedure, a History and Physical                            was performed, and patient medications and                            allergies were reviewed. The patient's tolerance of                            previous anesthesia was also reviewed. The risks                            and benefits of the procedure and the sedation                            options and risks were discussed with the patient.                            All questions were answered, and informed consent                            was obtained. Prior Anticoagulants: The patient has                            taken no anticoagulant or antiplatelet agents. ASA                            Grade Assessment: II - A patient with mild systemic                            disease. After reviewing the risks and benefits,                            the patient was deemed in satisfactory condition to                            undergo the procedure.  After obtaining informed consent, the endoscope was                            passed under direct vision. Throughout the                            procedure, the patient's blood pressure, pulse, and                            oxygen saturations were monitored continuously. The                            Olympus Scope [id number] was introduced through the                             mouth, and advanced to the second part of duodenum.                            The upper GI endoscopy was accomplished without                            difficulty. The patient tolerated the procedure                            well. Scope In: Scope Out: Findings:                 The larynx was normal.                           The esophagus was normal.                           Patchy mildly erythematous mucosa was found in the                            gastric fundus and in the gastric body. Biopsies                            were taken with a cold forceps for histology.                           The cardia and gastric fundus were normal on                            retroflexion. Distends well with insufflation.                            (Hill grade 3)                           Normal mucosa was found in the entire duodenum.                            Biopsies for histology were taken with a cold  forceps for evaluation of celiac disease. Complications:            No immediate complications. Estimated Blood Loss:     Estimated blood loss was minimal. Impression:               - Normal larynx.                           - Normal esophagus.                           - Erythematous mucosa in the gastric fundus and                            gastric body. Biopsied. Nonspecific finding of                            uncertain clinic significance.                           - Normal mucosa was found in the entire examined                            duodenum. Biopsied. Recommendation:           - Patient has a contact number available for                            emergencies. The signs and symptoms of potential                            delayed complications were discussed with the                            patient. Return to normal activities tomorrow.                            Written discharge instructions were provided to the                             patient.                           - Resume previous diet.                           - Continue present medications.                           - Await pathology results.                           - Inform our office if you decide to schedule the                            abdominal ultrasound we discussed at the office  visit.                           - Return to my office at an appointment to be                            scheduled. Please reconsider having the colonoscopy                            that we discussed at the office visit.                           (discussed with patieint and family through                            interpreter) Sherilyn Cooter L. Myrtie Neither, MD 03/24/2023 10:25:19 AM This report has been signed electronically.

## 2023-03-24 NOTE — Progress Notes (Signed)
 History and Physical:  This patient presents for endoscopic testing for: Encounter Diagnoses  Name Primary?   Generalized abdominal pain Yes   Nausea in adult    Heartburn     Clinical details in 01/27/23 office consult note, and no significant changes since then. Arabic interpreter present before procedure. Patient is otherwise without complaints or active issues today.   Past Medical History: Past Medical History:  Diagnosis Date   Hyperlipidemia    Hypertension      Past Surgical History: Past Surgical History:  Procedure Laterality Date   NO PAST SURGERIES      Allergies: No Known Allergies  Outpatient Meds: Current Outpatient Medications  Medication Sig Dispense Refill   cyclobenzaprine (FLEXERIL) 5 MG tablet Take 1 tablet (5 mg total) by mouth 3 (three) times daily as needed for muscle spasms. 30 tablet 1   lisinopril-hydrochlorothiazide (ZESTORETIC) 10-12.5 MG tablet Take 1 tablet by mouth daily. 90 tablet 3   omeprazole (PRILOSEC) 40 MG capsule Take 1 capsule (40 mg total) by mouth daily. 30 capsule 3   pravastatin (PRAVACHOL) 40 MG tablet TAKE 1 TABLET(40 MG) BY MOUTH DAILY 90 tablet 1   docusate sodium (COLACE) 100 MG capsule Take 1 capsule (100 mg total) by mouth 2 (two) times daily. 10 capsule 0   Current Facility-Administered Medications  Medication Dose Route Frequency Provider Last Rate Last Admin   0.9 %  sodium chloride infusion  500 mL Intravenous Once Sherrilyn Rist, MD          ___________________________________________________________________ Objective   Exam:  BP (!) 141/81   Pulse 70   Temp 98.2 F (36.8 C)   Ht 5\' 8"  (1.727 m)   Wt 172 lb (78 kg)   SpO2 100%   BMI 26.15 kg/m   CV: regular , S1/S2 Resp: clear to auscultation bilaterally, normal RR and effort noted GI: soft, no tenderness, with active bowel sounds.   Assessment: Encounter Diagnoses  Name Primary?   Generalized abdominal pain Yes   Nausea in adult     Heartburn      Plan:  EGD  The benefits and risks of the planned procedure(s) were described in detail with the patient or (when appropriate) their health care proxy.  Risks were outlined as including, but not limited to, bleeding, infection, perforation, adverse medication reaction leading to cardiac or pulmonary decompensation, pancreatitis (if ERCP).  The limitation of incomplete mucosal visualization was also discussed.  No guarantees or warranties were given.  The patient is appropriate for an endoscopic procedure in the ambulatory setting.   - Amada Jupiter, MD

## 2023-03-24 NOTE — Telephone Encounter (Signed)
-----   Message from Charlie Pitter III sent at 03/24/2023  4:52 PM EDT ----- This patient had an upper endoscopy with me today.  I saw her in the office 01/27/2023 and asked for a full abdominal ultrasound to be scheduled for generalized abdominal pain. It has not been done, and the patient's daughter told me today that she do not recall being given an appointment for her, nor they heard from radiology. Please contact this patient's daughter (who speaks [place] and Arabic, patient speaks Arabic), and make arrangements for the ultrasound.  Thank you  H Danis

## 2023-03-24 NOTE — Progress Notes (Signed)
 Interpreter used today at the [facility] for this pt.  Interpreter's name is- ENAYAT

## 2023-03-24 NOTE — Progress Notes (Signed)
 Due to language barrier, an interpreter Ralene Ok) was present during the history-taking and subsequent discussion (and for part of the physical exam) with this patient.

## 2023-03-24 NOTE — Progress Notes (Signed)
 Called to room to assist during endoscopic procedure.  Patient ID and intended procedure confirmed with present staff. Received instructions for my participation in the procedure from the performing physician.

## 2023-03-24 NOTE — Telephone Encounter (Signed)
 Order entered at office visit.  Sent to the schedulers to set up.

## 2023-03-24 NOTE — Progress Notes (Signed)
 Order was entered in January, sent order to the schedulers to set up.

## 2023-03-24 NOTE — Patient Instructions (Signed)
  Resume previous diet  Continue present medications  Awaiting pathology results   YOU HAD AN ENDOSCOPIC PROCEDURE TODAY AT THE [facility]:   Refer to the procedure report that was given to you for any specific questions about what was found during the examination.  If the procedure report does not answer your questions, please call your gastroenterologist to clarify.  If you requested that your care partner not be given the details of your procedure findings, then the procedure report has been included in a sealed envelope for you to review at your convenience later.  YOU SHOULD EXPECT: Some feelings of bloating in the abdomen. Passage of more gas than usual.  Walking can help get rid of the air that was put into your GI tract during the procedure and reduce the bloating. If you had a lower endoscopy (such as a colonoscopy or flexible sigmoidoscopy) you may notice spotting of blood in your stool or on the toilet paper. If you underwent a bowel prep for your procedure, you may not have a normal bowel movement for a few days.  Please Note:  You might notice some irritation and congestion in your nose or some drainage.  This is from the oxygen used during your procedure.  There is no need for concern and it should clear up in a day or so.  SYMPTOMS TO REPORT IMMEDIATELY:  Following upper endoscopy (EGD)  Vomiting of blood or coffee ground material  New chest pain or pain under the shoulder blades  Painful or persistently difficult swallowing  New shortness of breath  Fever of 100F or higher  Black, tarry-looking stools  For urgent or emergent issues, a gastroenterologist can be reached at any hour by calling [phone]. Do not use MyChart messaging for urgent concerns.    DIET:  We do recommend a small meal at first, but then you may proceed to your regular diet.  Drink plenty of fluids but you should avoid alcoholic beverages for 24 hours.  ACTIVITY:  You should plan to  take it easy for the rest of today and you should NOT DRIVE or use heavy machinery until tomorrow (because of the sedation medicines used during the test).    FOLLOW UP: Our staff will call the number listed on your records the next business day following your procedure.  We will call around 7:15- 8:00 am to check on you and address any questions or concerns that you may have regarding the information given to you following your procedure. If we do not reach you, we will leave a message.     If any biopsies were taken you will be contacted by phone or by letter within the next 1-3 weeks.  Please call us at [phone] if you have not heard about the biopsies in 3 weeks.    SIGNATURES/CONFIDENTIALITY: You and/or your care partner have signed paperwork which will be entered into your electronic medical record.  These signatures attest to the fact that that the information above on your After Visit Summary has been reviewed and is understood.  Full responsibility of the confidentiality of this discharge information lies with you and/or your care-partner.

## 2023-03-24 NOTE — Progress Notes (Signed)
0959 Robinul 0.1 mg IV given due large amount of secretions upon assessment.  MD made aware, vss 

## 2023-03-25 ENCOUNTER — Telehealth: Payer: Self-pay

## 2023-03-25 NOTE — Telephone Encounter (Signed)
  Follow up Call-     03/24/2023    9:34 AM  Call back number  Post procedure Call Back phone  # [phone]  Permission to leave phone message Yes    Interpreter ID [id number] used for Callback. VM left in Arabic language.

## 2023-03-28 LAB — SURGICAL PATHOLOGY

## 2023-03-30 ENCOUNTER — Other Ambulatory Visit: Payer: Self-pay | Admitting: *Deleted

## 2023-03-30 ENCOUNTER — Encounter: Payer: Self-pay | Admitting: *Deleted

## 2023-03-30 MED ORDER — DOXYCYCLINE HYCLATE 100 MG PO TABS
100.0000 mg | ORAL_TABLET | Freq: Two times a day (BID) | ORAL | 0 refills | Status: AC
Start: 2023-03-30 — End: 2023-04-13

## 2023-03-30 MED ORDER — METRONIDAZOLE 250 MG PO TABS: 250.0000 mg | ORAL_TABLET | Freq: Four times a day (QID) | ORAL | 0 refills | Status: AC

## 2023-03-30 MED ORDER — BISMUTH SUBSALICYLATE 262 MG PO CHEW: 262.0000 mg | CHEWABLE_TABLET | Freq: Four times a day (QID) | ORAL | 0 refills | Status: AC

## 2023-03-30 MED ORDER — OMEPRAZOLE 40 MG PO CPDR
40.0000 mg | DELAYED_RELEASE_CAPSULE | Freq: Two times a day (BID) | ORAL | 0 refills | Status: AC
Start: 2023-03-30 — End: 2023-04-13

## 2023-04-11 ENCOUNTER — Ambulatory Visit (HOSPITAL_COMMUNITY)
Admission: RE | Admit: 2023-04-11 | Discharge: 2023-04-11 | Disposition: A | Discharge status: Home / Self Care | Source: Ambulatory Visit | Attending: Gastroenterology | Admitting: Gastroenterology

## 2023-04-11 DIAGNOSIS — R12 Heartburn: Secondary | ICD-10-CM | POA: Insufficient documentation

## 2023-04-11 DIAGNOSIS — R112 Nausea with vomiting, unspecified: Secondary | ICD-10-CM | POA: Insufficient documentation

## 2023-04-11 DIAGNOSIS — R1084 Generalized abdominal pain: Secondary | ICD-10-CM | POA: Insufficient documentation

## 2023-04-11 DIAGNOSIS — R109 Unspecified abdominal pain: Secondary | ICD-10-CM | POA: Diagnosis not present

## 2023-04-13 ENCOUNTER — Encounter: Payer: Self-pay | Admitting: Gastroenterology

## 2023-04-16 DIAGNOSIS — Z419 Encounter for procedure for purposes other than remedying health state, unspecified: Secondary | ICD-10-CM | POA: Diagnosis not present

## 2023-05-12 ENCOUNTER — Telehealth: Payer: Self-pay

## 2023-05-12 DIAGNOSIS — A048 Other specified bacterial intestinal infections: Secondary | ICD-10-CM

## 2023-05-12 NOTE — Telephone Encounter (Signed)
 The pt has been reminded to have Urea breath test completed.  Order entered and pt advised via My Chart         This is a friendly reminder that you are due for a H. Pylori breath test at this time. This test will help us  determine if the H. Pylori infection has been eradicated. You will need to complete this test 3-4 weeks after completing treatment. You will need to hold PPI (Omeprazole , Pantoprazole, Esomeprazole, etc.) for 7 days prior to submitting the test.   Attached is the [facility] office information. You may go online to schedule an appointment or just walk-in.   If you have any questions or concerns please give us  a call at [phone].  Sincerely,   [facility]

## 2023-05-12 NOTE — Telephone Encounter (Signed)
-----   Message from Nurse Dede Fanny sent at 03/30/2023 11:02 AM EDT -----  ----- Message ----- From: Glennette Lanius, RN Sent: 03/30/2023  11:02 AM EDT To: [facility] Triage  See 03/24/23 surg path for original orders. Patient needs urea breath test around 05/16/23 to confirm h pylori eradication; need to place orders and remind pt of test. Will need to speak with daughter, Tomdur (pt speaks arabic); danis patient

## 2023-05-16 DIAGNOSIS — Z419 Encounter for procedure for purposes other than remedying health state, unspecified: Secondary | ICD-10-CM | POA: Diagnosis not present

## 2023-05-23 DIAGNOSIS — M1712 Unilateral primary osteoarthritis, left knee: Secondary | ICD-10-CM | POA: Diagnosis not present

## 2023-05-25 ENCOUNTER — Ambulatory Visit: Admitting: Gastroenterology

## 2023-06-16 DIAGNOSIS — Z419 Encounter for procedure for purposes other than remedying health state, unspecified: Secondary | ICD-10-CM | POA: Diagnosis not present

## 2023-06-20 ENCOUNTER — Encounter: Payer: Self-pay | Admitting: Nurse Practitioner

## 2023-06-20 ENCOUNTER — Ambulatory Visit (INDEPENDENT_AMBULATORY_CARE_PROVIDER_SITE_OTHER): Payer: Self-pay | Admitting: Nurse Practitioner

## 2023-06-20 VITALS — BP 130/58 | HR 72 | Temp 98.4°F | Wt 160.8 lb

## 2023-06-20 DIAGNOSIS — G4719 Other hypersomnia: Secondary | ICD-10-CM

## 2023-06-20 DIAGNOSIS — I10 Essential (primary) hypertension: Secondary | ICD-10-CM | POA: Diagnosis not present

## 2023-06-20 DIAGNOSIS — R0683 Snoring: Secondary | ICD-10-CM | POA: Diagnosis not present

## 2023-06-20 DIAGNOSIS — E559 Vitamin D deficiency, unspecified: Secondary | ICD-10-CM | POA: Diagnosis not present

## 2023-06-20 DIAGNOSIS — Z13220 Encounter for screening for lipoid disorders: Secondary | ICD-10-CM | POA: Diagnosis not present

## 2023-06-20 MED ORDER — OMEPRAZOLE 40 MG PO CPDR: 40.0000 mg | DELAYED_RELEASE_CAPSULE | Freq: Every day | ORAL | 3 refills | Status: DC

## 2023-06-20 MED ORDER — LISINOPRIL-HYDROCHLOROTHIAZIDE 10-12.5 MG PO TABS: 1.0000 | ORAL_TABLET | Freq: Every day | ORAL | 3 refills | Status: AC

## 2023-06-20 MED ORDER — PRAVASTATIN SODIUM 40 MG PO TABS: 40.0000 mg | ORAL_TABLET | Freq: Every day | ORAL | 1 refills | Status: AC

## 2023-06-20 NOTE — Progress Notes (Signed)
 Subjective   Patient ID: April Bell, female    DOB: 08/12/1960, 63 y.o.   MRN: [id number]  No chief complaint on file.   Referring provider: Jerrlyn Morel, NP  April Bell is a 63 y.o. female with Past Medical History: No date: H. pylori infection No date: Hyperlipidemia No date: Hypertension   HPI  Patient presents today for a follow-up on blood pressure. She has been tolerating blood pressure medicine well. Her blood pressure is good in the office today. She does not check blood pressures at home. We did discuss low-salt diet and exercise in office today. We did use interpreter today. Complains of snoring and extreme fatigue during the day. Will place referral for sleep study.    No Known Allergies  Immunization History  Administered Date(s) Administered   Influenza, Seasonal, Injecte, Preservative Fre 10/06/2022   Influenza,inj,Quad PF,6+ Mos 09/17/2014, 12/07/2016, 09/19/2017, 12/24/2020, 10/19/2021   PFIZER(Purple Top)SARS-COV-2 Vaccination 04/19/2019, 05/14/2019   Tdap 03/08/2016    Tobacco History: Social History   Tobacco Use  Smoking Status Never  Smokeless Tobacco Never   Counseling given: Not Answered   Outpatient Encounter Medications as of 06/20/2023  Medication Sig   cyclobenzaprine  (FLEXERIL ) 5 MG tablet Take 1 tablet (5 mg total) by mouth 3 (three) times daily as needed for muscle spasms.   docusate sodium  (COLACE) 100 MG capsule Take 1 capsule (100 mg total) by mouth 2 (two) times daily.   [DISCONTINUED] lisinopril -hydrochlorothiazide  (ZESTORETIC ) 10-12.5 MG tablet Take 1 tablet by mouth daily.   [DISCONTINUED] omeprazole  (PRILOSEC) 40 MG capsule Take 1 capsule (40 mg total) by mouth daily.   [DISCONTINUED] pravastatin  (PRAVACHOL ) 40 MG tablet TAKE 1 TABLET(40 MG) BY MOUTH DAILY   lisinopril -hydrochlorothiazide  (ZESTORETIC ) 10-12.5 MG tablet Take 1 tablet by mouth daily.   omeprazole  (PRILOSEC) 40 MG capsule Take 1 capsule (40 mg  total) by mouth 2 (two) times daily for 14 days. Decrease back to once daily dosing thereafter   omeprazole  (PRILOSEC) 40 MG capsule Take 1 capsule (40 mg total) by mouth daily.   pravastatin  (PRAVACHOL ) 40 MG tablet Take 1 tablet (40 mg total) by mouth daily.   No facility-administered encounter medications on file as of 06/20/2023.    Review of Systems  Review of Systems  Constitutional: Negative.   HENT: Negative.    Cardiovascular: Negative.   Gastrointestinal: Negative.   Allergic/Immunologic: Negative.   Neurological: Negative.   Psychiatric/Behavioral: Negative.       Objective:   BP (!) 130/58   Pulse 72   Temp 98.4 F (36.9 C) (Oral)   Wt 160 lb 12.8 oz (72.9 kg)   SpO2 99%   BMI 24.45 kg/m   Wt Readings from Last 5 Encounters:  06/20/23 160 lb 12.8 oz (72.9 kg)  03/24/23 172 lb (78 kg)  03/14/23 172 lb (78 kg)  02/14/23 172 lb 3.2 oz (78.1 kg)  01/31/23 167 lb (75.8 kg)     Physical Exam Vitals and nursing note reviewed.  Constitutional:      General: She is not in acute distress.    Appearance: She is well-developed.   Cardiovascular:     Rate and Rhythm: Normal rate and regular rhythm.  Pulmonary:     Effort: Pulmonary effort is normal.     Breath sounds: Normal breath sounds.   Neurological:     Mental Status: She is alert and oriented to person, place, and time.       Assessment & Plan:  Vitamin D  deficiency -     VITAMIN D  25 Hydroxy (Vit-D Deficiency, Fractures)  Primary hypertension -     Lisinopril -hydroCHLOROthiazide ; Take 1 tablet by mouth daily.  Dispense: 120 tablet; Refill: 3 -     CBC -     Comprehensive metabolic panel with GFR  Lipid screening -     Pravastatin  Sodium; Take 1 tablet (40 mg total) by mouth daily.  Dispense: 120 tablet; Refill: 1 -     Lipid panel  Snores -     Pulmonary Visit  Excessive daytime sleepiness -     Pulmonary Visit  Other orders -     Omeprazole ; Take 1 capsule (40 mg total) by mouth  daily.  Dispense: 240 capsule; Refill: 3     Return in about 6 months (around 12/20/2023).   Jerrlyn Morel, NP 06/20/2023

## 2023-06-21 LAB — COMPREHENSIVE METABOLIC PANEL WITH GFR
ALT: 13 IU/L (ref 0–32)
AST: 21 IU/L (ref 0–40)
Albumin: 4.1 g/dL (ref 3.9–4.9)
Alkaline Phosphatase: 69 IU/L (ref 44–121)
BUN/Creatinine Ratio: 15 (ref 12–28)
BUN: 12 mg/dL (ref 8–27)
Bilirubin Total: 0.3 mg/dL (ref 0.0–1.2)
CO2: 22 mmol/L (ref 20–29)
Calcium: 9.6 mg/dL (ref 8.7–10.3)
Chloride: 102 mmol/L (ref 96–106)
Creatinine, Ser: 0.81 mg/dL (ref 0.57–1.00)
Globulin, Total: 2.7 g/dL (ref 1.5–4.5)
Glucose: 104 mg/dL — ABNORMAL HIGH (ref 70–99)
Potassium: 4.9 mmol/L (ref 3.5–5.2)
Sodium: 138 mmol/L (ref 134–144)
Total Protein: 6.8 g/dL (ref 6.0–8.5)
eGFR: 82 mL/min/{1.73_m2} (ref >59)

## 2023-06-21 LAB — LIPID PANEL
Chol/HDL Ratio: 3.2 ratio (ref 0.0–4.4)
Cholesterol, Total: 240 mg/dL — ABNORMAL HIGH (ref 100–199)
HDL: 76 mg/dL (ref >39)
LDL Chol Calc (NIH): 154 mg/dL — ABNORMAL HIGH (ref 0–99)
Triglycerides: 59 mg/dL (ref 0–149)
VLDL Cholesterol Cal: 10 mg/dL (ref 5–40)

## 2023-06-21 LAB — CBC
Hematocrit: 44.3 % (ref 34.0–46.6)
Hemoglobin: 14.5 g/dL (ref 11.1–15.9)
MCH: 29.2 pg (ref 26.6–33.0)
MCHC: 32.7 g/dL (ref 31.5–35.7)
MCV: 89 fL (ref 79–97)
Platelets: 239 10*3/uL (ref 150–450)
RBC: 4.97 x10E6/uL (ref 3.77–5.28)
RDW: 12.9 % (ref 11.7–15.4)
WBC: 4.8 10*3/uL (ref 3.4–10.8)

## 2023-06-21 LAB — VITAMIN D 25 HYDROXY (VIT D DEFICIENCY, FRACTURES): Vit D, 25-Hydroxy: 35.6 ng/mL (ref 30.0–100.0)

## 2023-06-22 ENCOUNTER — Ambulatory Visit: Payer: Self-pay | Admitting: Nurse Practitioner

## 2023-07-12 ENCOUNTER — Ambulatory Visit: Admitting: Gastroenterology

## 2023-07-16 DIAGNOSIS — Z419 Encounter for procedure for purposes other than remedying health state, unspecified: Secondary | ICD-10-CM | POA: Diagnosis not present

## 2023-07-22 ENCOUNTER — Other Ambulatory Visit: Payer: Self-pay

## 2023-07-22 MED ORDER — OMEPRAZOLE 40 MG PO CPDR: 40.0000 mg | DELAYED_RELEASE_CAPSULE | Freq: Every day | ORAL | 3 refills | Status: AC

## 2023-08-01 ENCOUNTER — Ambulatory Visit: Payer: Self-pay | Admitting: Nurse Practitioner

## 2023-08-16 DIAGNOSIS — Z419 Encounter for procedure for purposes other than remedying health state, unspecified: Secondary | ICD-10-CM | POA: Diagnosis not present

## 2023-09-16 DIAGNOSIS — Z419 Encounter for procedure for purposes other than remedying health state, unspecified: Secondary | ICD-10-CM | POA: Diagnosis not present

## 2023-11-09 ENCOUNTER — Other Ambulatory Visit: Payer: Self-pay | Admitting: Nurse Practitioner

## 2023-11-09 DIAGNOSIS — R030 Elevated blood-pressure reading, without diagnosis of hypertension: Secondary | ICD-10-CM

## 2023-11-10 ENCOUNTER — Encounter: Payer: Self-pay | Admitting: Nurse Practitioner

## 2023-12-26 ENCOUNTER — Ambulatory Visit: Payer: Self-pay | Admitting: Nurse Practitioner

## 2024-01-09 ENCOUNTER — Ambulatory Visit: Payer: Self-pay | Admitting: Nurse Practitioner

## 2024-01-09 NOTE — Telephone Encounter (Signed)
 Sent patient a MyChart message to R/S and appointment and a voicemail was left using the interpreter services to R/S appointment.  01/09/24

## 2024-01-12 ENCOUNTER — Encounter: Payer: Self-pay | Admitting: Nurse Practitioner

## 2024-02-09 ENCOUNTER — Other Ambulatory Visit: Payer: Self-pay | Admitting: Nurse Practitioner

## 2024-02-09 DIAGNOSIS — I10 Essential (primary) hypertension: Secondary | ICD-10-CM
